# Patient Record
Sex: Female | Born: 1950 | Race: White | Hispanic: No | Smoking: Never smoker
Health system: Southern US, Community
[De-identification: ages and names within clinical notes are randomized; demographics above are authoritative.]

## PROBLEM LIST (undated history)

## (undated) DIAGNOSIS — F32A Depression, unspecified: Secondary | ICD-10-CM

## (undated) DIAGNOSIS — F419 Anxiety disorder, unspecified: Secondary | ICD-10-CM

## (undated) DIAGNOSIS — F329 Major depressive disorder, single episode, unspecified: Secondary | ICD-10-CM

## (undated) DIAGNOSIS — M75112 Incomplete rotator cuff tear or rupture of left shoulder, not specified as traumatic: Secondary | ICD-10-CM

## (undated) DIAGNOSIS — K219 Gastro-esophageal reflux disease without esophagitis: Secondary | ICD-10-CM

## (undated) DIAGNOSIS — I10 Essential (primary) hypertension: Secondary | ICD-10-CM

## (undated) HISTORY — PX: TONSILLECTOMY: SUR1361

## (undated) HISTORY — PX: ABDOMINAL HYSTERECTOMY: SHX81

## (undated) HISTORY — PX: FINGER SURGERY: SHX640

## (undated) HISTORY — PX: BREAST SURGERY: SHX581

---
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
## 1983-01-08 HISTORY — PX: AUGMENTATION MAMMAPLASTY: SUR837

## 2006-07-25 ENCOUNTER — Other Ambulatory Visit: Admission: RE | Admit: 2006-07-25 | Discharge: 2006-07-25 | Payer: Self-pay | Admitting: Family Medicine

## 2006-11-16 ENCOUNTER — Emergency Department (HOSPITAL_COMMUNITY): Admission: EM | Admit: 2006-11-16 | Discharge: 2006-11-16 | Payer: Self-pay | Admitting: Emergency Medicine

## 2010-10-27 ENCOUNTER — Emergency Department (HOSPITAL_COMMUNITY)
Admission: EM | Admit: 2010-10-27 | Discharge: 2010-10-27 | Disposition: A | Discharge status: Home / Self Care | Payer: 59 | Attending: Emergency Medicine | Admitting: Emergency Medicine

## 2010-10-27 ENCOUNTER — Emergency Department (HOSPITAL_COMMUNITY): Payer: 59

## 2010-10-27 DIAGNOSIS — R42 Dizziness and giddiness: Secondary | ICD-10-CM | POA: Insufficient documentation

## 2010-10-27 DIAGNOSIS — K5289 Other specified noninfective gastroenteritis and colitis: Secondary | ICD-10-CM | POA: Insufficient documentation

## 2010-10-27 LAB — DIFFERENTIAL
Basophils Absolute: 0 10*3/uL (ref 0.0–0.1)
Basophils Relative: 0 % (ref 0–1)
Eosinophils Relative: 1 % (ref 0–5)

## 2010-10-27 LAB — COMPREHENSIVE METABOLIC PANEL
AST: 19 U/L (ref 0–37)
Albumin: 4.2 g/dL (ref 3.5–5.2)
Alkaline Phosphatase: 76 U/L (ref 39–117)
BUN: 16 mg/dL (ref 6–23)
Chloride: 101 mEq/L (ref 96–112)
Glucose, Bld: 103 mg/dL — ABNORMAL HIGH (ref 70–99)
Potassium: 3.6 mEq/L (ref 3.5–5.1)
Total Protein: 7.2 g/dL (ref 6.0–8.3)

## 2010-10-27 LAB — CBC
MCH: 30.4 pg (ref 26.0–34.0)
MCHC: 33.6 g/dL (ref 30.0–36.0)
MCV: 90.5 fL (ref 78.0–100.0)
Platelets: 286 10*3/uL (ref 150–400)
RBC: 4.44 MIL/uL (ref 3.87–5.11)
RDW: 13.5 % (ref 11.5–15.5)

## 2010-10-27 LAB — URINALYSIS, ROUTINE W REFLEX MICROSCOPIC
Bilirubin Urine: NEGATIVE
Glucose, UA: NEGATIVE mg/dL
Ketones, ur: NEGATIVE mg/dL
pH: 6.5 (ref 5.0–8.0)

## 2010-10-27 LAB — LIPASE, BLOOD: Lipase: 28 U/L (ref 11–59)

## 2010-10-27 MED ORDER — IOHEXOL 300 MG/ML  SOLN
100.0000 mL | Freq: Once | INTRAMUSCULAR | Status: AC | PRN
  Administered 2010-10-27: 100 mL via INTRAVENOUS

## 2011-04-30 ENCOUNTER — Other Ambulatory Visit: Payer: Self-pay | Admitting: Gastroenterology

## 2011-05-02 ENCOUNTER — Ambulatory Visit
Admission: RE | Admit: 2011-05-02 | Discharge: 2011-05-02 | Disposition: A | Discharge status: Home / Self Care | Payer: 59 | Source: Ambulatory Visit | Attending: Gastroenterology | Admitting: Gastroenterology

## 2011-05-03 ENCOUNTER — Other Ambulatory Visit: Payer: 59

## 2014-08-03 ENCOUNTER — Ambulatory Visit
Admission: RE | Admit: 2014-08-03 | Discharge: 2014-08-03 | Disposition: A | Discharge status: Home / Self Care | Payer: Medicare Other | Source: Ambulatory Visit | Attending: Family Medicine | Admitting: Family Medicine

## 2014-08-03 ENCOUNTER — Other Ambulatory Visit: Payer: Self-pay | Admitting: Family Medicine

## 2014-08-03 DIAGNOSIS — M545 Low back pain, unspecified: Secondary | ICD-10-CM

## 2015-02-27 DIAGNOSIS — H2511 Age-related nuclear cataract, right eye: Secondary | ICD-10-CM | POA: Diagnosis not present

## 2015-02-27 DIAGNOSIS — H25811 Combined forms of age-related cataract, right eye: Secondary | ICD-10-CM | POA: Diagnosis not present

## 2015-02-28 DIAGNOSIS — H2512 Age-related nuclear cataract, left eye: Secondary | ICD-10-CM | POA: Diagnosis not present

## 2015-03-09 DIAGNOSIS — K219 Gastro-esophageal reflux disease without esophagitis: Secondary | ICD-10-CM | POA: Diagnosis not present

## 2015-03-09 DIAGNOSIS — R12 Heartburn: Secondary | ICD-10-CM | POA: Diagnosis not present

## 2015-03-16 DIAGNOSIS — K449 Diaphragmatic hernia without obstruction or gangrene: Secondary | ICD-10-CM | POA: Diagnosis not present

## 2015-03-16 DIAGNOSIS — R12 Heartburn: Secondary | ICD-10-CM | POA: Diagnosis not present

## 2015-03-16 DIAGNOSIS — K219 Gastro-esophageal reflux disease without esophagitis: Secondary | ICD-10-CM | POA: Diagnosis not present

## 2015-03-20 DIAGNOSIS — H2512 Age-related nuclear cataract, left eye: Secondary | ICD-10-CM | POA: Diagnosis not present

## 2015-03-20 DIAGNOSIS — H25812 Combined forms of age-related cataract, left eye: Secondary | ICD-10-CM | POA: Diagnosis not present

## 2015-05-09 DIAGNOSIS — H9319 Tinnitus, unspecified ear: Secondary | ICD-10-CM | POA: Diagnosis not present

## 2015-05-09 DIAGNOSIS — I10 Essential (primary) hypertension: Secondary | ICD-10-CM | POA: Diagnosis not present

## 2015-05-09 DIAGNOSIS — Z79890 Hormone replacement therapy (postmenopausal): Secondary | ICD-10-CM | POA: Diagnosis not present

## 2015-05-09 DIAGNOSIS — F339 Major depressive disorder, recurrent, unspecified: Secondary | ICD-10-CM | POA: Diagnosis not present

## 2015-05-09 DIAGNOSIS — Z79899 Other long term (current) drug therapy: Secondary | ICD-10-CM | POA: Diagnosis not present

## 2015-05-09 DIAGNOSIS — F411 Generalized anxiety disorder: Secondary | ICD-10-CM | POA: Diagnosis not present

## 2015-05-09 DIAGNOSIS — E559 Vitamin D deficiency, unspecified: Secondary | ICD-10-CM | POA: Diagnosis not present

## 2015-05-09 DIAGNOSIS — E7800 Pure hypercholesterolemia, unspecified: Secondary | ICD-10-CM | POA: Diagnosis not present

## 2015-05-09 DIAGNOSIS — M8580 Other specified disorders of bone density and structure, unspecified site: Secondary | ICD-10-CM | POA: Diagnosis not present

## 2015-08-01 DIAGNOSIS — H1132 Conjunctival hemorrhage, left eye: Secondary | ICD-10-CM | POA: Diagnosis not present

## 2015-11-16 DIAGNOSIS — K219 Gastro-esophageal reflux disease without esophagitis: Secondary | ICD-10-CM | POA: Diagnosis not present

## 2015-12-26 DIAGNOSIS — Z23 Encounter for immunization: Secondary | ICD-10-CM | POA: Diagnosis not present

## 2016-02-06 DIAGNOSIS — Z79899 Other long term (current) drug therapy: Secondary | ICD-10-CM | POA: Diagnosis not present

## 2016-02-06 DIAGNOSIS — E559 Vitamin D deficiency, unspecified: Secondary | ICD-10-CM | POA: Diagnosis not present

## 2016-02-06 DIAGNOSIS — I10 Essential (primary) hypertension: Secondary | ICD-10-CM | POA: Diagnosis not present

## 2016-02-06 DIAGNOSIS — E7800 Pure hypercholesterolemia, unspecified: Secondary | ICD-10-CM | POA: Diagnosis not present

## 2016-02-06 DIAGNOSIS — F339 Major depressive disorder, recurrent, unspecified: Secondary | ICD-10-CM | POA: Diagnosis not present

## 2016-02-06 DIAGNOSIS — Z23 Encounter for immunization: Secondary | ICD-10-CM | POA: Diagnosis not present

## 2016-02-06 DIAGNOSIS — K219 Gastro-esophageal reflux disease without esophagitis: Secondary | ICD-10-CM | POA: Diagnosis not present

## 2016-02-06 DIAGNOSIS — M8580 Other specified disorders of bone density and structure, unspecified site: Secondary | ICD-10-CM | POA: Diagnosis not present

## 2016-02-06 DIAGNOSIS — F411 Generalized anxiety disorder: Secondary | ICD-10-CM | POA: Diagnosis not present

## 2016-02-06 DIAGNOSIS — Z0000 Encounter for general adult medical examination without abnormal findings: Secondary | ICD-10-CM | POA: Diagnosis not present

## 2016-02-07 ENCOUNTER — Other Ambulatory Visit: Payer: Self-pay | Admitting: Family Medicine

## 2016-02-07 DIAGNOSIS — Z01411 Encounter for gynecological examination (general) (routine) with abnormal findings: Secondary | ICD-10-CM

## 2016-02-13 ENCOUNTER — Ambulatory Visit
Admission: RE | Admit: 2016-02-13 | Discharge: 2016-02-13 | Disposition: A | Discharge status: Home / Self Care | Payer: Medicare Other | Source: Ambulatory Visit | Attending: Family Medicine | Admitting: Family Medicine

## 2016-02-13 DIAGNOSIS — Z1231 Encounter for screening mammogram for malignant neoplasm of breast: Secondary | ICD-10-CM | POA: Diagnosis not present

## 2016-02-13 DIAGNOSIS — R935 Abnormal findings on diagnostic imaging of other abdominal regions, including retroperitoneum: Secondary | ICD-10-CM | POA: Diagnosis not present

## 2016-02-13 DIAGNOSIS — Z01411 Encounter for gynecological examination (general) (routine) with abnormal findings: Secondary | ICD-10-CM

## 2016-02-13 DIAGNOSIS — M8589 Other specified disorders of bone density and structure, multiple sites: Secondary | ICD-10-CM | POA: Diagnosis not present

## 2016-02-20 DIAGNOSIS — M25561 Pain in right knee: Secondary | ICD-10-CM | POA: Diagnosis not present

## 2016-05-01 DIAGNOSIS — B353 Tinea pedis: Secondary | ICD-10-CM | POA: Diagnosis not present

## 2016-08-29 DIAGNOSIS — L570 Actinic keratosis: Secondary | ICD-10-CM | POA: Diagnosis not present

## 2016-08-29 DIAGNOSIS — L814 Other melanin hyperpigmentation: Secondary | ICD-10-CM | POA: Diagnosis not present

## 2016-08-29 DIAGNOSIS — L821 Other seborrheic keratosis: Secondary | ICD-10-CM | POA: Diagnosis not present

## 2016-08-29 DIAGNOSIS — D1801 Hemangioma of skin and subcutaneous tissue: Secondary | ICD-10-CM | POA: Diagnosis not present

## 2017-01-11 DIAGNOSIS — J069 Acute upper respiratory infection, unspecified: Secondary | ICD-10-CM | POA: Diagnosis not present

## 2017-01-16 DIAGNOSIS — Z23 Encounter for immunization: Secondary | ICD-10-CM | POA: Diagnosis not present

## 2017-01-16 DIAGNOSIS — E7800 Pure hypercholesterolemia, unspecified: Secondary | ICD-10-CM | POA: Diagnosis not present

## 2017-01-16 DIAGNOSIS — E559 Vitamin D deficiency, unspecified: Secondary | ICD-10-CM | POA: Diagnosis not present

## 2017-01-16 DIAGNOSIS — I10 Essential (primary) hypertension: Secondary | ICD-10-CM | POA: Diagnosis not present

## 2017-02-07 DIAGNOSIS — K219 Gastro-esophageal reflux disease without esophagitis: Secondary | ICD-10-CM | POA: Diagnosis not present

## 2017-02-07 DIAGNOSIS — Z23 Encounter for immunization: Secondary | ICD-10-CM | POA: Diagnosis not present

## 2017-02-07 DIAGNOSIS — E7800 Pure hypercholesterolemia, unspecified: Secondary | ICD-10-CM | POA: Diagnosis not present

## 2017-02-07 DIAGNOSIS — E559 Vitamin D deficiency, unspecified: Secondary | ICD-10-CM | POA: Diagnosis not present

## 2017-02-10 DIAGNOSIS — L820 Inflamed seborrheic keratosis: Secondary | ICD-10-CM | POA: Diagnosis not present

## 2017-02-10 DIAGNOSIS — D0462 Carcinoma in situ of skin of left upper limb, including shoulder: Secondary | ICD-10-CM | POA: Diagnosis not present

## 2017-02-10 DIAGNOSIS — L218 Other seborrheic dermatitis: Secondary | ICD-10-CM | POA: Diagnosis not present

## 2017-02-10 DIAGNOSIS — D485 Neoplasm of uncertain behavior of skin: Secondary | ICD-10-CM | POA: Diagnosis not present

## 2017-02-10 DIAGNOSIS — L570 Actinic keratosis: Secondary | ICD-10-CM | POA: Diagnosis not present

## 2017-04-09 DIAGNOSIS — B078 Other viral warts: Secondary | ICD-10-CM | POA: Diagnosis not present

## 2017-04-09 DIAGNOSIS — D0462 Carcinoma in situ of skin of left upper limb, including shoulder: Secondary | ICD-10-CM | POA: Diagnosis not present

## 2017-04-15 DIAGNOSIS — E7800 Pure hypercholesterolemia, unspecified: Secondary | ICD-10-CM | POA: Diagnosis not present

## 2017-04-15 DIAGNOSIS — E559 Vitamin D deficiency, unspecified: Secondary | ICD-10-CM | POA: Diagnosis not present

## 2017-04-15 DIAGNOSIS — I10 Essential (primary) hypertension: Secondary | ICD-10-CM | POA: Diagnosis not present

## 2017-04-15 DIAGNOSIS — F411 Generalized anxiety disorder: Secondary | ICD-10-CM | POA: Diagnosis not present

## 2017-05-07 ENCOUNTER — Ambulatory Visit (INDEPENDENT_AMBULATORY_CARE_PROVIDER_SITE_OTHER): Payer: Medicare Other

## 2017-05-07 ENCOUNTER — Encounter: Payer: Self-pay | Admitting: Podiatry

## 2017-05-07 ENCOUNTER — Ambulatory Visit: Payer: Medicare Other | Admitting: Podiatry

## 2017-05-07 VITALS — BP 132/72 | HR 66

## 2017-05-07 DIAGNOSIS — M778 Other enthesopathies, not elsewhere classified: Secondary | ICD-10-CM

## 2017-05-07 DIAGNOSIS — L84 Corns and callosities: Secondary | ICD-10-CM

## 2017-05-07 DIAGNOSIS — M2040 Other hammer toe(s) (acquired), unspecified foot: Secondary | ICD-10-CM | POA: Diagnosis not present

## 2017-05-07 DIAGNOSIS — M779 Enthesopathy, unspecified: Secondary | ICD-10-CM

## 2017-05-07 MED ORDER — TRIAMCINOLONE ACETONIDE 10 MG/ML IJ SUSP
10.0000 mg | Freq: Once | INTRAMUSCULAR | Status: AC
  Administered 2017-05-07: 10 mg

## 2017-05-07 NOTE — Progress Notes (Signed)
Subjective:   Patient ID: Monica Houston, female   DOB: 67 y.o.   MRN: [id number]   HPI Patient presents stating I am having a lot of pain between the fourth and fifth toes of both my feet with the right being worse and is been going on for at least 10 years and I do not remember specific injury.  Patient states she tries to trim it and soak it as best she can and she does not smoke and likes to be active   Review of Systems  All other systems reviewed and are negative.       Objective:  Physical Exam  Constitutional: She appears well-developed and well-nourished.  Cardiovascular: Intact distal pulses.  Pulmonary/Chest: Effort normal.  Musculoskeletal: Normal range of motion.  Neurological: She is alert.  Skin: Skin is warm.  Nursing note and vitals reviewed.   Neurovascular status intact muscle strength is adequate range of motion within normal limits with severe keratotic lesion between the fourth and fifth toes of both feet that are inflamed and painful with pressure.  States that she has tried to trim without relief and does have good digital perfusion and well oriented x3     Assessment:  Inflammatory capsulitis fourth interspace right over left hammertoe deformity and abutment of the toe against the fourth toe     Plan:  H&P conditions reviewed and carefully injected the capsule of the fourth interspace bilateral 3 mg Kenalog 5 mg Xylocaine and around the fourth MPJ.  I then debrided the lesions and discussed ultimate partial soft tissue syndactylization with hammertoe arthroplasty procedures which I think most likely will be necessary  X-rays indicate there is slight enlargement of the head of the proximal phalanx fifth digit bilateral with rotation of the toe

## 2017-05-14 DIAGNOSIS — H524 Presbyopia: Secondary | ICD-10-CM | POA: Diagnosis not present

## 2017-07-08 DIAGNOSIS — H26491 Other secondary cataract, right eye: Secondary | ICD-10-CM | POA: Diagnosis not present

## 2017-07-08 DIAGNOSIS — Z961 Presence of intraocular lens: Secondary | ICD-10-CM | POA: Diagnosis not present

## 2017-07-08 DIAGNOSIS — H26492 Other secondary cataract, left eye: Secondary | ICD-10-CM | POA: Diagnosis not present

## 2017-07-08 DIAGNOSIS — I10 Essential (primary) hypertension: Secondary | ICD-10-CM | POA: Diagnosis not present

## 2017-07-29 DIAGNOSIS — H26492 Other secondary cataract, left eye: Secondary | ICD-10-CM | POA: Diagnosis not present

## 2017-09-09 DIAGNOSIS — Z23 Encounter for immunization: Secondary | ICD-10-CM | POA: Diagnosis not present

## 2017-09-09 DIAGNOSIS — M545 Low back pain: Secondary | ICD-10-CM | POA: Diagnosis not present

## 2017-09-11 DIAGNOSIS — K219 Gastro-esophageal reflux disease without esophagitis: Secondary | ICD-10-CM | POA: Diagnosis not present

## 2017-09-15 DIAGNOSIS — M2560 Stiffness of unspecified joint, not elsewhere classified: Secondary | ICD-10-CM | POA: Diagnosis not present

## 2017-09-15 DIAGNOSIS — M5431 Sciatica, right side: Secondary | ICD-10-CM | POA: Diagnosis not present

## 2017-09-15 DIAGNOSIS — M6281 Muscle weakness (generalized): Secondary | ICD-10-CM | POA: Diagnosis not present

## 2017-09-15 DIAGNOSIS — M545 Low back pain: Secondary | ICD-10-CM | POA: Diagnosis not present

## 2017-09-19 DIAGNOSIS — M6281 Muscle weakness (generalized): Secondary | ICD-10-CM | POA: Diagnosis not present

## 2017-09-19 DIAGNOSIS — M5431 Sciatica, right side: Secondary | ICD-10-CM | POA: Diagnosis not present

## 2017-09-19 DIAGNOSIS — M2560 Stiffness of unspecified joint, not elsewhere classified: Secondary | ICD-10-CM | POA: Diagnosis not present

## 2017-09-19 DIAGNOSIS — M545 Low back pain: Secondary | ICD-10-CM | POA: Diagnosis not present

## 2017-09-24 DIAGNOSIS — M6281 Muscle weakness (generalized): Secondary | ICD-10-CM | POA: Diagnosis not present

## 2017-09-24 DIAGNOSIS — M2560 Stiffness of unspecified joint, not elsewhere classified: Secondary | ICD-10-CM | POA: Diagnosis not present

## 2017-09-24 DIAGNOSIS — M545 Low back pain: Secondary | ICD-10-CM | POA: Diagnosis not present

## 2017-09-24 DIAGNOSIS — M5431 Sciatica, right side: Secondary | ICD-10-CM | POA: Diagnosis not present

## 2017-09-26 DIAGNOSIS — M6281 Muscle weakness (generalized): Secondary | ICD-10-CM | POA: Diagnosis not present

## 2017-09-26 DIAGNOSIS — M545 Low back pain: Secondary | ICD-10-CM | POA: Diagnosis not present

## 2017-09-26 DIAGNOSIS — M5431 Sciatica, right side: Secondary | ICD-10-CM | POA: Diagnosis not present

## 2017-09-26 DIAGNOSIS — M2560 Stiffness of unspecified joint, not elsewhere classified: Secondary | ICD-10-CM | POA: Diagnosis not present

## 2017-09-29 DIAGNOSIS — M6281 Muscle weakness (generalized): Secondary | ICD-10-CM | POA: Diagnosis not present

## 2017-09-29 DIAGNOSIS — M2560 Stiffness of unspecified joint, not elsewhere classified: Secondary | ICD-10-CM | POA: Diagnosis not present

## 2017-09-29 DIAGNOSIS — M5431 Sciatica, right side: Secondary | ICD-10-CM | POA: Diagnosis not present

## 2017-09-29 DIAGNOSIS — M545 Low back pain: Secondary | ICD-10-CM | POA: Diagnosis not present

## 2017-11-07 DIAGNOSIS — E559 Vitamin D deficiency, unspecified: Secondary | ICD-10-CM | POA: Diagnosis not present

## 2017-11-07 DIAGNOSIS — Z1159 Encounter for screening for other viral diseases: Secondary | ICD-10-CM | POA: Diagnosis not present

## 2017-11-07 DIAGNOSIS — E7800 Pure hypercholesterolemia, unspecified: Secondary | ICD-10-CM | POA: Diagnosis not present

## 2017-11-07 DIAGNOSIS — Z8589 Personal history of malignant neoplasm of other organs and systems: Secondary | ICD-10-CM | POA: Diagnosis not present

## 2017-11-07 DIAGNOSIS — I10 Essential (primary) hypertension: Secondary | ICD-10-CM | POA: Diagnosis not present

## 2018-01-12 DIAGNOSIS — D1801 Hemangioma of skin and subcutaneous tissue: Secondary | ICD-10-CM | POA: Diagnosis not present

## 2018-01-12 DIAGNOSIS — L820 Inflamed seborrheic keratosis: Secondary | ICD-10-CM | POA: Diagnosis not present

## 2018-01-12 DIAGNOSIS — D225 Melanocytic nevi of trunk: Secondary | ICD-10-CM | POA: Diagnosis not present

## 2018-01-12 DIAGNOSIS — L821 Other seborrheic keratosis: Secondary | ICD-10-CM | POA: Diagnosis not present

## 2018-02-26 DIAGNOSIS — Z0120 Encounter for dental examination and cleaning without abnormal findings: Secondary | ICD-10-CM | POA: Diagnosis not present

## 2018-06-09 DIAGNOSIS — S46011A Strain of muscle(s) and tendon(s) of the rotator cuff of right shoulder, initial encounter: Secondary | ICD-10-CM | POA: Diagnosis not present

## 2018-06-25 DIAGNOSIS — S46011D Strain of muscle(s) and tendon(s) of the rotator cuff of right shoulder, subsequent encounter: Secondary | ICD-10-CM | POA: Diagnosis not present

## 2018-07-02 DIAGNOSIS — M25511 Pain in right shoulder: Secondary | ICD-10-CM | POA: Diagnosis not present

## 2018-07-23 DIAGNOSIS — M7541 Impingement syndrome of right shoulder: Secondary | ICD-10-CM | POA: Diagnosis not present

## 2018-07-27 DIAGNOSIS — Z01818 Encounter for other preprocedural examination: Secondary | ICD-10-CM | POA: Diagnosis not present

## 2018-07-27 DIAGNOSIS — M75101 Unspecified rotator cuff tear or rupture of right shoulder, not specified as traumatic: Secondary | ICD-10-CM | POA: Diagnosis not present

## 2018-07-29 ENCOUNTER — Encounter (HOSPITAL_BASED_OUTPATIENT_CLINIC_OR_DEPARTMENT_OTHER): Payer: Self-pay | Admitting: *Deleted

## 2018-07-29 ENCOUNTER — Other Ambulatory Visit: Payer: Self-pay

## 2018-07-29 DIAGNOSIS — I10 Essential (primary) hypertension: Secondary | ICD-10-CM | POA: Diagnosis present

## 2018-07-29 DIAGNOSIS — M75112 Incomplete rotator cuff tear or rupture of left shoulder, not specified as traumatic: Secondary | ICD-10-CM | POA: Diagnosis present

## 2018-07-29 DIAGNOSIS — F329 Major depressive disorder, single episode, unspecified: Secondary | ICD-10-CM | POA: Diagnosis present

## 2018-07-29 DIAGNOSIS — K219 Gastro-esophageal reflux disease without esophagitis: Secondary | ICD-10-CM | POA: Diagnosis present

## 2018-07-29 DIAGNOSIS — F32A Depression, unspecified: Secondary | ICD-10-CM | POA: Diagnosis present

## 2018-07-29 DIAGNOSIS — F419 Anxiety disorder, unspecified: Secondary | ICD-10-CM | POA: Diagnosis present

## 2018-07-29 NOTE — H&P (Signed)
Monica Houston is an 68 y.o. female.   Chief Complaint: right shoulder rotator cuff tear HPI: Monica Houston is a 68 year-old seen for follow up evaluation from her persistent significant right shoulder pain.  She underwent an MRI of this shoulder on July 02, 2018 that revealed rotator cuff tendonitis with partial tearing with impingement with significant AC joint osteoarthritis and impingement as well, as well as some moderate glenohumeral arthritis.  We had injected this shoulder with only temporary relief.  She has been doing home exercises intermittently.    Past Medical History:  Diagnosis Date  . Anxiety   . Depression   . GERD (gastroesophageal reflux disease)   . Hypertension     Past Surgical History:  Procedure Laterality Date  . ABDOMINAL HYSTERECTOMY    . BREAST SURGERY     breast augmentation  . CESAREAN SECTION     x2  . TONSILLECTOMY      History reviewed. No pertinent family history. Social History:  reports that she has never smoked. She has never used smokeless tobacco. She reports current alcohol use. She reports that she does not use drugs.  Allergies: No Known Allergies  No medications prior to admission.    No results found for this or any previous visit (from the past 48 hour(s)). No results found.  Review of Systems  Constitutional: Negative.   HENT: Negative.   Eyes: Negative.   Respiratory: Negative.   Cardiovascular: Negative.   Gastrointestinal: Negative.   Genitourinary: Negative.   Musculoskeletal: Positive for joint pain.  Skin: Negative.   Neurological: Negative.   Endo/Heme/Allergies: Negative.   Psychiatric/Behavioral: Negative.     Height 5\' 2"  (1.575 m), weight 63.5 kg. Physical Exam  Constitutional: She is oriented to person, place, and time. She appears well-developed and well-nourished.  HENT:  Head: Normocephalic and atraumatic.  Mouth/Throat: Oropharynx is clear and moist.  Eyes: Pupils are equal, round, and reactive to light.   Neck: Neck supple.  Cardiovascular: Normal rate.  Respiratory: Effort normal.  GI: Soft.  Genitourinary:    Genitourinary Comments: Not pertinent to current symptomatology therefore not examined.   Musculoskeletal:     Comments: Examination of her right shoulder reveals forward flexion of 170 with pain and positive impingement.  Abduction of 160 with pain and positive impingement.  Internal and external rotation of 80 degrees with pain and mild weakness and impingement.  No instability.  Examination of her left shoulder reveals full range of motion without pain, weakness or instability.  Vascular exam: Pulses are 2+ and symmetric.  Neurologic exam: Distal motor and sensory examination is within normal limits.  Neurological: She is alert and oriented to person, place, and time.  Skin: Skin is warm and dry.  Psychiatric: She has a normal mood and affect.     Assessment Principal Problem:   Incomplete rotator cuff tear or rupture of left shoulder, not specified as traumatic Active Problems:   Hypertension   GERD (gastroesophageal reflux disease)   Depression   Anxiety   Plan At this point due to failed conservative care would recommend that we proceed with right shoulder arthroscopy with rotator cuff debridement with subacromial decompression and DCE.  Risks, complications and benefits of the surgery have been described to her in detail and she understands this completely.    Carlisle Enke J Wael Maestas, PA-C 07/29/2018, 6:12 PM

## 2018-08-01 ENCOUNTER — Other Ambulatory Visit (HOSPITAL_COMMUNITY)
Admission: RE | Admit: 2018-08-01 | Discharge: 2018-08-01 | Disposition: A | Discharge status: Home / Self Care | Payer: Medicare Other | Source: Ambulatory Visit | Attending: Orthopedic Surgery | Admitting: Orthopedic Surgery

## 2018-08-01 DIAGNOSIS — Z1159 Encounter for screening for other viral diseases: Secondary | ICD-10-CM | POA: Diagnosis not present

## 2018-08-01 LAB — SARS CORONAVIRUS 2 (TAT 6-24 HRS): SARS Coronavirus 2: NEGATIVE

## 2018-08-05 ENCOUNTER — Encounter (HOSPITAL_BASED_OUTPATIENT_CLINIC_OR_DEPARTMENT_OTHER)
Admission: RE | Disposition: A | Discharge status: Home / Self Care | Payer: Self-pay | Source: Home / Self Care | Attending: Orthopedic Surgery

## 2018-08-05 ENCOUNTER — Ambulatory Visit (HOSPITAL_BASED_OUTPATIENT_CLINIC_OR_DEPARTMENT_OTHER): Payer: Medicare Other | Admitting: Certified Registered"

## 2018-08-05 ENCOUNTER — Other Ambulatory Visit: Payer: Self-pay

## 2018-08-05 ENCOUNTER — Ambulatory Visit (HOSPITAL_BASED_OUTPATIENT_CLINIC_OR_DEPARTMENT_OTHER)
Admission: RE | Admit: 2018-08-05 | Discharge: 2018-08-05 | Disposition: A | Discharge status: Home / Self Care | Payer: Medicare Other | Attending: Orthopedic Surgery | Admitting: Orthopedic Surgery

## 2018-08-05 ENCOUNTER — Encounter (HOSPITAL_BASED_OUTPATIENT_CLINIC_OR_DEPARTMENT_OTHER): Payer: Self-pay | Admitting: Anesthesiology

## 2018-08-05 DIAGNOSIS — M24111 Other articular cartilage disorders, right shoulder: Secondary | ICD-10-CM | POA: Diagnosis not present

## 2018-08-05 DIAGNOSIS — F329 Major depressive disorder, single episode, unspecified: Secondary | ICD-10-CM | POA: Insufficient documentation

## 2018-08-05 DIAGNOSIS — F32A Depression, unspecified: Secondary | ICD-10-CM | POA: Diagnosis present

## 2018-08-05 DIAGNOSIS — Z90710 Acquired absence of both cervix and uterus: Secondary | ICD-10-CM | POA: Insufficient documentation

## 2018-08-05 DIAGNOSIS — M75101 Unspecified rotator cuff tear or rupture of right shoulder, not specified as traumatic: Secondary | ICD-10-CM | POA: Diagnosis present

## 2018-08-05 DIAGNOSIS — M7541 Impingement syndrome of right shoulder: Secondary | ICD-10-CM | POA: Insufficient documentation

## 2018-08-05 DIAGNOSIS — M94211 Chondromalacia, right shoulder: Secondary | ICD-10-CM | POA: Diagnosis not present

## 2018-08-05 DIAGNOSIS — I10 Essential (primary) hypertension: Secondary | ICD-10-CM | POA: Diagnosis not present

## 2018-08-05 DIAGNOSIS — M75111 Incomplete rotator cuff tear or rupture of right shoulder, not specified as traumatic: Secondary | ICD-10-CM | POA: Diagnosis not present

## 2018-08-05 DIAGNOSIS — M19011 Primary osteoarthritis, right shoulder: Secondary | ICD-10-CM | POA: Insufficient documentation

## 2018-08-05 DIAGNOSIS — K219 Gastro-esophageal reflux disease without esophagitis: Secondary | ICD-10-CM | POA: Diagnosis not present

## 2018-08-05 DIAGNOSIS — F419 Anxiety disorder, unspecified: Secondary | ICD-10-CM | POA: Diagnosis present

## 2018-08-05 DIAGNOSIS — G8918 Other acute postprocedural pain: Secondary | ICD-10-CM | POA: Diagnosis not present

## 2018-08-05 DIAGNOSIS — M75112 Incomplete rotator cuff tear or rupture of left shoulder, not specified as traumatic: Secondary | ICD-10-CM | POA: Diagnosis present

## 2018-08-05 HISTORY — PX: RESECTION DISTAL CLAVICAL: SHX5053

## 2018-08-05 HISTORY — DX: Gastro-esophageal reflux disease without esophagitis: K21.9

## 2018-08-05 HISTORY — PX: SHOULDER ARTHROSCOPY WITH SUBACROMIAL DECOMPRESSION: SHX5684

## 2018-08-05 HISTORY — DX: Anxiety disorder, unspecified: F41.9

## 2018-08-05 HISTORY — DX: Incomplete rotator cuff tear or rupture of left shoulder, not specified as traumatic: M75.112

## 2018-08-05 HISTORY — DX: Essential (primary) hypertension: I10

## 2018-08-05 HISTORY — DX: Depression, unspecified: F32.A

## 2018-08-05 LAB — BASIC METABOLIC PANEL
Anion gap: 11 (ref 5–15)
BUN: 15 mg/dL (ref 8–23)
CO2: 24 mmol/L (ref 22–32)
Calcium: 9.2 mg/dL (ref 8.9–10.3)
Chloride: 106 mmol/L (ref 98–111)
Creatinine, Ser: 0.79 mg/dL (ref 0.44–1.00)
GFR calc Af Amer: >60 mL/min (ref >60)
GFR calc non Af Amer: >60 mL/min (ref >60)
Glucose, Bld: 99 mg/dL (ref 70–99)
Potassium: 3.8 mmol/L (ref 3.5–5.1)
Sodium: 141 mmol/L (ref 135–145)

## 2018-08-05 SURGERY — SHOULDER ARTHROSCOPY WITH SUBACROMIAL DECOMPRESSION
Anesthesia: General | Site: Shoulder | Laterality: Right

## 2018-08-05 MED ORDER — FENTANYL CITRATE (PF) 100 MCG/2ML IJ SOLN
INTRAMUSCULAR | Status: AC
  Filled 2018-08-05: qty 2

## 2018-08-05 MED ORDER — OXYCODONE HCL 5 MG/5ML PO SOLN: 5.0000 mg | Freq: Once | ORAL | Status: DC | PRN

## 2018-08-05 MED ORDER — SODIUM CHLORIDE 0.9 % IR SOLN
Status: DC | PRN
  Administered 2018-08-05: 2 mL

## 2018-08-05 MED ORDER — BUPIVACAINE LIPOSOME 1.3 % IJ SUSP
INTRAMUSCULAR | Status: DC | PRN
  Administered 2018-08-05: 10 mL via PERINEURAL

## 2018-08-05 MED ORDER — SCOPOLAMINE 1 MG/3DAYS TD PT72: 1.0000 | MEDICATED_PATCH | Freq: Once | TRANSDERMAL | Status: DC

## 2018-08-05 MED ORDER — MIDAZOLAM HCL 2 MG/2ML IJ SOLN
1.0000 mg | INTRAMUSCULAR | Status: DC | PRN
  Administered 2018-08-05: 1 mg via INTRAVENOUS

## 2018-08-05 MED ORDER — PROPOFOL 500 MG/50ML IV EMUL
INTRAVENOUS | Status: AC
  Filled 2018-08-05: qty 50

## 2018-08-05 MED ORDER — CHLORHEXIDINE GLUCONATE 4 % EX LIQD: 60.0000 mL | Freq: Once | CUTANEOUS | Status: DC

## 2018-08-05 MED ORDER — LIDOCAINE 2% (20 MG/ML) 5 ML SYRINGE
INTRAMUSCULAR | Status: AC
  Filled 2018-08-05: qty 5

## 2018-08-05 MED ORDER — FENTANYL CITRATE (PF) 100 MCG/2ML IJ SOLN: 25.0000 ug | INTRAMUSCULAR | Status: DC | PRN

## 2018-08-05 MED ORDER — DEXAMETHASONE SODIUM PHOSPHATE 10 MG/ML IJ SOLN: 8.0000 mg | Freq: Once | INTRAMUSCULAR | Status: DC

## 2018-08-05 MED ORDER — BUPIVACAINE-EPINEPHRINE (PF) 0.25% -1:200000 IJ SOLN
INTRAMUSCULAR | Status: AC
  Filled 2018-08-05: qty 60

## 2018-08-05 MED ORDER — DEXAMETHASONE SODIUM PHOSPHATE 4 MG/ML IJ SOLN
INTRAMUSCULAR | Status: DC | PRN
  Administered 2018-08-05: 4 mg via INTRAVENOUS

## 2018-08-05 MED ORDER — ROCURONIUM BROMIDE 100 MG/10ML IV SOLN
INTRAVENOUS | Status: DC | PRN
  Administered 2018-08-05: 50 mg via INTRAVENOUS

## 2018-08-05 MED ORDER — DEXAMETHASONE SODIUM PHOSPHATE 10 MG/ML IJ SOLN
INTRAMUSCULAR | Status: AC
  Filled 2018-08-05: qty 1

## 2018-08-05 MED ORDER — MIDAZOLAM HCL 2 MG/2ML IJ SOLN
INTRAMUSCULAR | Status: AC
  Filled 2018-08-05: qty 2

## 2018-08-05 MED ORDER — POVIDONE-IODINE 10 % EX SWAB: 2.0000 "application " | Freq: Once | CUTANEOUS | Status: DC

## 2018-08-05 MED ORDER — LACTATED RINGERS IV SOLN
INTRAVENOUS | Status: DC
  Administered 2018-08-05: 10:00:00 via INTRAVENOUS

## 2018-08-05 MED ORDER — OXYCODONE HCL 5 MG PO TABS: 5.0000 mg | ORAL_TABLET | ORAL | 0 refills | Status: DC | PRN

## 2018-08-05 MED ORDER — FENTANYL CITRATE (PF) 100 MCG/2ML IJ SOLN
50.0000 ug | INTRAMUSCULAR | Status: DC | PRN
  Administered 2018-08-05: 50 ug via INTRAVENOUS

## 2018-08-05 MED ORDER — OXYCODONE HCL 5 MG PO TABS: 5.0000 mg | ORAL_TABLET | Freq: Once | ORAL | Status: DC | PRN

## 2018-08-05 MED ORDER — LIDOCAINE 2% (20 MG/ML) 5 ML SYRINGE
INTRAMUSCULAR | Status: DC | PRN
  Administered 2018-08-05: 40 mg via INTRAVENOUS

## 2018-08-05 MED ORDER — EPINEPHRINE PF 1 MG/ML IJ SOLN
INTRAMUSCULAR | Status: AC
  Filled 2018-08-05: qty 2

## 2018-08-05 MED ORDER — ONDANSETRON HCL 4 MG/2ML IJ SOLN: 4.0000 mg | Freq: Once | INTRAMUSCULAR | Status: DC | PRN

## 2018-08-05 MED ORDER — PROPOFOL 10 MG/ML IV BOLUS
INTRAVENOUS | Status: DC | PRN
  Administered 2018-08-05: 150 mg via INTRAVENOUS

## 2018-08-05 MED ORDER — BUPIVACAINE HCL (PF) 0.25 % IJ SOLN
INTRAMUSCULAR | Status: AC
  Filled 2018-08-05: qty 30

## 2018-08-05 MED ORDER — ONDANSETRON HCL 4 MG/2ML IJ SOLN
INTRAMUSCULAR | Status: AC
  Filled 2018-08-05: qty 2

## 2018-08-05 MED ORDER — CEFAZOLIN SODIUM-DEXTROSE 2-4 GM/100ML-% IV SOLN
2.0000 g | INTRAVENOUS | Status: AC
  Administered 2018-08-05: 11:00:00 2 g via INTRAVENOUS

## 2018-08-05 MED ORDER — SUGAMMADEX SODIUM 200 MG/2ML IV SOLN
INTRAVENOUS | Status: DC | PRN
  Administered 2018-08-05: 200 mg via INTRAVENOUS

## 2018-08-05 MED ORDER — CEFAZOLIN SODIUM-DEXTROSE 2-4 GM/100ML-% IV SOLN
INTRAVENOUS | Status: AC
  Filled 2018-08-05: qty 100

## 2018-08-05 MED ORDER — EPHEDRINE SULFATE 50 MG/ML IJ SOLN
INTRAMUSCULAR | Status: DC | PRN
  Administered 2018-08-05: 10 mg via INTRAVENOUS
  Administered 2018-08-05: 5 mg via INTRAVENOUS

## 2018-08-05 MED ORDER — LACTATED RINGERS IV SOLN
INTRAVENOUS | Status: DC
  Administered 2018-08-05: 10 mL via INTRAVENOUS
  Administered 2018-08-05: 10:00:00 via INTRAVENOUS

## 2018-08-05 MED ORDER — BUPIVACAINE HCL (PF) 0.5 % IJ SOLN
INTRAMUSCULAR | Status: DC | PRN
  Administered 2018-08-05: 15 mL via PERINEURAL

## 2018-08-05 MED ORDER — POVIDONE-IODINE 7.5 % EX SOLN: Freq: Once | CUTANEOUS | Status: DC

## 2018-08-05 MED ORDER — ONDANSETRON HCL 4 MG/2ML IJ SOLN
INTRAMUSCULAR | Status: DC | PRN
  Administered 2018-08-05: 4 mg via INTRAVENOUS

## 2018-08-05 SURGICAL SUPPLY — 79 items
BENZOIN TINCTURE PRP APPL 2/3 (GAUZE/BANDAGES/DRESSINGS) IMPLANT
BLADE EXCALIBUR 4.0X13 (MISCELLANEOUS) IMPLANT
BLADE SURG 15 STRL LF DISP TIS (BLADE) IMPLANT
BLADE SURG 15 STRL SS (BLADE)
BNDG COHESIVE 4X5 TAN STRL (GAUZE/BANDAGES/DRESSINGS) IMPLANT
BURR OVAL 8 FLU 5.0X13 (MISCELLANEOUS) ×3 IMPLANT
CANNULA TWIST IN 8.25X7CM (CANNULA) IMPLANT
COVER WAND RF STERILE (DRAPES) IMPLANT
DECANTER SPIKE VIAL GLASS SM (MISCELLANEOUS) IMPLANT
DISSECTOR  3.8MM X 13CM (MISCELLANEOUS) ×1
DISSECTOR 3.8MM X 13CM (MISCELLANEOUS) ×2 IMPLANT
DRAPE HALF SHEET 70X43 (DRAPES) IMPLANT
DRAPE OEC MINIVIEW 54X84 (DRAPES) ×3 IMPLANT
DRAPE SHOULDER BEACH CHAIR (DRAPES) ×3 IMPLANT
DRAPE U-SHAPE 47X51 STRL (DRAPES) ×6 IMPLANT
DRSG PAD ABDOMINAL 8X10 ST (GAUZE/BANDAGES/DRESSINGS) ×3 IMPLANT
DURAPREP 26ML APPLICATOR (WOUND CARE) ×3 IMPLANT
ELECT REM PT RETURN 9FT ADLT (ELECTROSURGICAL) ×3
ELECTRODE REM PT RTRN 9FT ADLT (ELECTROSURGICAL) ×2 IMPLANT
GAUZE SPONGE 4X4 12PLY STRL (GAUZE/BANDAGES/DRESSINGS) ×3 IMPLANT
GAUZE XEROFORM 1X8 LF (GAUZE/BANDAGES/DRESSINGS) ×3 IMPLANT
GLOVE BIO SURGEON STRL SZ7 (GLOVE) IMPLANT
GLOVE BIOGEL PI IND STRL 7.0 (GLOVE) ×2 IMPLANT
GLOVE BIOGEL PI IND STRL 7.5 (GLOVE) ×2 IMPLANT
GLOVE BIOGEL PI IND STRL 8 (GLOVE) ×2 IMPLANT
GLOVE BIOGEL PI INDICATOR 7.0 (GLOVE) ×1
GLOVE BIOGEL PI INDICATOR 7.5 (GLOVE) ×1
GLOVE BIOGEL PI INDICATOR 8 (GLOVE) ×1
GLOVE ECLIPSE 6.5 STRL STRAW (GLOVE) ×3 IMPLANT
GLOVE SS BIOGEL STRL SZ 7.5 (GLOVE) ×2 IMPLANT
GLOVE SUPERSENSE BIOGEL SZ 7.5 (GLOVE) ×1
GOWN STRL REUS W/ TWL LRG LVL3 (GOWN DISPOSABLE) ×4 IMPLANT
GOWN STRL REUS W/ TWL XL LVL3 (GOWN DISPOSABLE) ×2 IMPLANT
GOWN STRL REUS W/TWL LRG LVL3 (GOWN DISPOSABLE) ×2
GOWN STRL REUS W/TWL XL LVL3 (GOWN DISPOSABLE) ×1
LOOP 2 FIBERLINK CLOSED (SUTURE) IMPLANT
MANIFOLD NEPTUNE II (INSTRUMENTS) ×3 IMPLANT
NDL SAFETY ECLIPSE 18X1.5 (NEEDLE) ×2 IMPLANT
NDL SUT 6 .5 CRC .975X.05 MAYO (NEEDLE) IMPLANT
NEEDLE 1/2 CIR CATGUT .05X1.09 (NEEDLE) IMPLANT
NEEDLE HYPO 18GX1.5 SHARP (NEEDLE) ×1
NEEDLE MAYO TAPER (NEEDLE)
NEEDLE SCORPION MULTI FIRE (NEEDLE) IMPLANT
PACK ARTHROSCOPY DSU (CUSTOM PROCEDURE TRAY) ×3 IMPLANT
PACK BASIN DAY SURGERY FS (CUSTOM PROCEDURE TRAY) ×3 IMPLANT
PAD ALCOHOL SWAB (MISCELLANEOUS) ×6 IMPLANT
PENCIL BUTTON HOLSTER BLD 10FT (ELECTRODE) IMPLANT
PORT APPOLLO RF 90DEGREE MULTI (SURGICAL WAND) ×3 IMPLANT
RESTRAINT HEAD UNIVERSAL NS (MISCELLANEOUS) ×3 IMPLANT
SLEEVE SCD COMPRESS KNEE MED (MISCELLANEOUS) ×3 IMPLANT
SLING ARM FOAM STRAP LRG (SOFTGOODS) IMPLANT
SLING ARM FOAM STRAP MED (SOFTGOODS) ×3 IMPLANT
SLING ARM IMMOBILIZER MED (SOFTGOODS) IMPLANT
SLING ARM MED ADULT FOAM STRAP (SOFTGOODS) IMPLANT
SLING ARM XL FOAM STRAP (SOFTGOODS) IMPLANT
SLING ULTRA III MED (ORTHOPEDIC SUPPLIES) IMPLANT
SPONGE LAP 4X18 RFD (DISPOSABLE) ×3 IMPLANT
STRIP CLOSURE SKIN 1/2X4 (GAUZE/BANDAGES/DRESSINGS) IMPLANT
SUCTION FRAZIER HANDLE 10FR (MISCELLANEOUS) ×1
SUCTION TUBE FRAZIER 10FR DISP (MISCELLANEOUS) ×2 IMPLANT
SUT ETHILON 3 0 PS 1 (SUTURE) ×3 IMPLANT
SUT FIBERWIRE #2 38 T-5 BLUE (SUTURE)
SUT PDS AB 2-0 CT2 27 (SUTURE) IMPLANT
SUT PROLENE 3 0 PS 2 (SUTURE) IMPLANT
SUT TIGER TAPE 7 IN WHITE (SUTURE) IMPLANT
SUT VIC AB 0 SH 27 (SUTURE) IMPLANT
SUT VIC AB 2-0 PS2 27 (SUTURE) IMPLANT
SUT VIC AB 2-0 SH 27 (SUTURE)
SUT VIC AB 2-0 SH 27XBRD (SUTURE) IMPLANT
SUTURE FIBERWR #2 38 T-5 BLUE (SUTURE) IMPLANT
SYR 5ML LL (SYRINGE) ×3 IMPLANT
SYR BULB 3OZ (MISCELLANEOUS) IMPLANT
TAPE FIBER 2MM 7IN #2 BLUE (SUTURE) IMPLANT
TAPE HYPAFIX 6X30 (GAUZE/BANDAGES/DRESSINGS) IMPLANT
TAPE STRIPS DRAPE STRL (GAUZE/BANDAGES/DRESSINGS) ×3 IMPLANT
TOWEL GREEN STERILE FF (TOWEL DISPOSABLE) ×3 IMPLANT
TUBE CONNECTING 20X1/4 (TUBING) IMPLANT
TUBING ARTHROSCOPY IRRIG 16FT (MISCELLANEOUS) ×3 IMPLANT
WATER STERILE IRR 1000ML POUR (IV SOLUTION) ×3 IMPLANT

## 2018-08-05 NOTE — Anesthesia Procedure Notes (Signed)
Anesthesia Regional Block: Interscalene brachial plexus block   Pre-Anesthetic Checklist: ,, timeout performed, Correct Patient, Correct Site, Correct Laterality, Correct Procedure, Correct Position, site marked, Risks and benefits discussed,  Surgical consent,  Pre-op evaluation,  At surgeon's request and post-op pain management  Laterality: Right  Prep: chloraprep       Needles:  Injection technique: Single-shot  Needle Type: Echogenic Needle     Needle Length: 5cm  Needle Gauge: 21     Additional Needles:   Narrative:  Start time: 08/05/2018 10:11 AM End time: 08/05/2018 10:14 AM Injection made incrementally with aspirations every 5 mL.  Performed by: Personally  Anesthesiologist: Audry Pili, MD  Additional Notes: No pain on injection. No increased resistance to injection. Injection made in 5cc increments. Good needle visualization. Patient tolerated the procedure well.

## 2018-08-05 NOTE — Interval H&P Note (Signed)
History and Physical Interval Note:  08/05/2018 10:28 AM  Monica Houston  has presented today for surgery, with the diagnosis of RIGHT SHOULDER PRIMARY OSTEOARTHRITIS IMPINGEMENT  ROTATOR CUFF TEAR.  The various methods of treatment have been discussed with the patient and family. After consideration of risks, benefits and other options for treatment, the patient has consented to  Procedure(s) with comments: SHOULDER ARTHROSCOPY DEBRIDEMENT  WITH ROTATOR CUFF REPAIR AND SUBACROMIAL DECOMPRESSION AND DISTAL CLAVICULECTOMY (Right) - POST OP SCALENE as a surgical intervention.  The patient's history has been reviewed, patient examined, no change in status, stable for surgery.  I have reviewed the patient's chart and labs.  Questions were answered to the patient's satisfaction.     Monica Houston

## 2018-08-05 NOTE — Anesthesia Procedure Notes (Signed)
Procedure Name: Intubation Date/Time: 08/05/2018 10:41 AM Performed by: Maryella Shivers, CRNA Pre-anesthesia Checklist: Patient identified, Emergency Drugs available, Suction available and Patient being monitored Patient Re-evaluated:Patient Re-evaluated prior to induction Oxygen Delivery Method: Circle system utilized Preoxygenation: Pre-oxygenation with 100% oxygen Induction Type: IV induction Ventilation: Mask ventilation without difficulty Laryngoscope Size: Mac and 3 Grade View: Grade I Tube type: Oral Tube size: 7.0 mm Number of attempts: 1 Airway Equipment and Method: Stylet and Oral airway Placement Confirmation: ETT inserted through vocal cords under direct vision,  positive ETCO2 and breath sounds checked- equal and bilateral Secured at: 20 cm Tube secured with: Tape Dental Injury: Teeth and Oropharynx as per pre-operative assessment

## 2018-08-05 NOTE — Progress Notes (Signed)
Assisted Dr. Brock with right, ultrasound guided, interscalene  block. Side rails up, monitors on throughout procedure. See vital signs in flow sheet. Tolerated Procedure well.  

## 2018-08-05 NOTE — Op Note (Signed)
Monica Houston, Monica Houston MEDICAL RECORD DT:[id number] ACCOUNT [id number] DATE OF BIRTH:November 02, 1950 FACILITY: [facility] LOCATION: [facility] PHYSICIAN:Orson Rho Venetia Maxon, MD  OPERATIVE REPORT  DATE OF PROCEDURE:  08/05/2018  PREOPERATIVE DIAGNOSES:   1.  Right shoulder chronic traumatic partial rotator cuff tear, partial labrum tear. 2.  Right shoulder chondromalacia. 3.  Right shoulder chronic nontraumatic impingement. 4.  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  POSTOPERATIVE DIAGNOSES: 1.  Right shoulder chronic traumatic partial rotator cuff tear, partial labrum tear. 2.  Right shoulder chondromalacia. 3.  Right shoulder chronic nontraumatic impingement. 4.  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  PROCEDURE PERFORMED: 1.  Right shoulder exam under anesthesia followed by arthroscopic debridement, partial labrum tear with chondroplasty -- extensive. 2.  Right shoulder subacromial decompression. 3.  Right shoulder mini open distal clavicle excision.  SURGEON:  Elsie Saas, MD  ASSISTANT:  Matthew Saras, PA.  ANESTHESIA:  General.  OPERATIVE TIME:  One hour.  COMPLICATIONS:  None.  INDICATIONS:  The patient is a 68 year old woman who has had over a year of right shoulder pain increasing in nature.  Exam and MRI has revealed a partial rotator cuff tear, partial labrum tear with chondromalacia with impingement and AC joint  osteoarthritis and impingement.  She has failed multiple conservative modalities and is now to undergo arthroscopy.  DESCRIPTION OF PROCEDURE:  This patient was brought to the operating room on 08/05/2018 after an interscalene block was placed in the holding room by anesthesia.  She was placed on the operating table in supine position.  She received antibiotics  preoperatively for prophylaxis.  After being placed under general anesthesia, right shoulder was examined.  She had full range of motion  and her shoulder was stable ligamentous exam.  She was then placed in beach chair position and her shoulder and arm  was prepped and using sterile DuraPrep and draped using sterile technique.  Time-out procedure was called and the correct right shoulder identified.  Initially through a posterior arthroscopic portal, the arthroscope with pump attached was placed in  through an anterior portal, an arthroscopic probe was placed.  On initial inspection, the articular cartilage in the glenohumeral joint showed 75%-80% grade III chondromalacia on the glenoid and humeral head and this was debrided.  She had partial  tearing of the anterior, superior and posterior labrum 25%, which was debrided.  The anterior inferior labrum and anterior inferior glenohumeral ligament complex was intact.  The biceps tendon anchor was somewhat hypermobile, but otherwise attached.  The  biceps tendon was intact.  The rotator cuff showed a partial tear of the supraspinatus 30%-40%, which was debrided, but it was otherwise well attached and the rest of the rotator cuff was intact.  Inferior capsular recess was free of pathology.   Subacromial space was entered and a lateral arthroscopic portal was made.  A large amount of bursitis was resected.  The rotator cuff was frayed on the bursal surface, but no deep tearing was noted.  Impingement was noted as the undersurface of the  acromion was digging into the rotator cuff and a subacromial decompression was carried out removing 6-8 mm of the undersurface of the anterior, anterolateral and anteromedial acromion and CA ligament release carried out as well.  The [facility] joint has such a  large and hypertrophic spurs that a mini open technique was needed to perform a satisfactory distal clavicle excision.  Through a 3 cm longitudinal incision over the [facility] joint initial exposure was made.  Underlying subcutaneous tissues were  incised along  with skin incision.  The capsule was incised and carefully  subperiosteal dissection was carried out to the [facility] joint.  Degenerative meniscus in the [facility] joint was resected.  At this point, using an oscillating saw the distal 8-9 mm of clavicle was resected  and then superior spurring was also removed.  There were also significant spurs superiorly on the medial aspect of the acromion and these were removed as well.  After this was done, intraoperative fluoroscopy confirmed excellent distal clavicle excision.   At this point, the wound was irrigated and the capsule was closed with running 2-0 Vicryl suture, subcutaneous tissues closed with 2-0 Vicryl, subcuticular layer closed with 4-0 Prolene.  Arthroscopic portals closed with 4-0 Prolene.  Sterile dressings  were applied and a sling and the patient awakened and taken to recovery room in stable condition.  Needle and sponge counts correct x2 at the end of the case.  FOLLOWUP CARE:  This patient will be followed as an outpatient on oxycodone and tizanidine with early physical therapy.  She will be seen back in the office in a week for sutures out and followup.  TN/NUANCE  D:08/05/2018 T:08/05/2018 JOB:[id number]

## 2018-08-05 NOTE — Anesthesia Postprocedure Evaluation (Signed)
Anesthesia Post Note  Patient: Monica Houston  Procedure(s) Performed: SHOULDER ARTHROSCOPY DEBRIDEMENT  SUBACROMIAL DECOMPRESSION  CLAVICULECTOMY (Right Shoulder) DISTAL CLAVICLE RESECTION (Right Shoulder)     Patient location during evaluation: PACU Anesthesia Type: General Level of consciousness: awake and alert Pain management: pain level controlled Vital Signs Assessment: post-procedure vital signs reviewed and stable Respiratory status: spontaneous breathing, nonlabored ventilation and respiratory function stable Cardiovascular status: blood pressure returned to baseline and stable Postop Assessment: no apparent nausea or vomiting Anesthetic complications: no    Last Vitals:  Vitals:   08/05/18 1230 08/05/18 1256  BP: 135/77 (!) 141/74  Pulse: 86 87  Resp: 20 18  Temp:  36.8 C  SpO2: (!) 89% 95%    Last Pain:  Vitals:   08/05/18 1300  TempSrc:   PainSc: 0-No pain                 Audry Pili

## 2018-08-05 NOTE — Transfer of Care (Signed)
Immediate Anesthesia Transfer of Care Note  Patient: Monica Houston  Procedure(s) Performed: SHOULDER ARTHROSCOPY DEBRIDEMENT  WITH ROTATOR CUFF REPAIR AND SUBACROMIAL DECOMPRESSION  CLAVICULECTOMY (Right Shoulder) SHOULDER ARTHROSCOPY WITH OPEN ROTATOR CUFF REPAIR AND DISTAL CLAVICLE ACROMINECTOMY (Right Shoulder)  Patient Location: PACU  Anesthesia Type:GA combined with regional for post-op pain  Level of Consciousness: sedated  Airway & Oxygen Therapy: Patient Spontanous Breathing and Patient connected to nasal cannula oxygen  Post-op Assessment: Report given to RN and Post -op Vital signs reviewed and stable  Post vital signs: Reviewed and stable  Last Vitals:  Vitals Value Taken Time  BP 154/90 08/05/18 1151  Temp    Pulse 96 08/05/18 1153  Resp 24 08/05/18 1153  SpO2 97 % 08/05/18 1153  Vitals shown include unvalidated device data.  Last Pain:  Vitals:   08/05/18 0932  TempSrc: Oral  PainSc: 0-No pain         Complications: No apparent anesthesia complications

## 2018-08-05 NOTE — Discharge Instructions (Signed)
Post Anesthesia Home Care Instructions  Activity: Get plenty of rest for the remainder of the day. A responsible individual must stay with you for 24 hours following the procedure.  For the next 24 hours, DO NOT: -Drive a car -Operate machinery -Drink alcoholic beverages -Take any medication unless instructed by your physician -Make any legal decisions or sign important papers.  Meals: Start with liquid foods such as gelatin or soup. Progress to regular foods as tolerated. Avoid greasy, spicy, heavy foods. If nausea and/or vomiting occur, drink only clear liquids until the nausea and/or vomiting subsides. Call your physician if vomiting continues.  Special Instructions/Symptoms: Your throat may feel dry or sore from the anesthesia or the breathing tube placed in your throat during surgery. If this causes discomfort, gargle with warm salt water. The discomfort should disappear within 24 hours.  If you had a scopolamine patch placed behind your ear for the management of post- operative nausea and/or vomiting:  1. The medication in the patch is effective for 72 hours, after which it should be removed.  Wrap patch in a tissue and discard in the trash. Wash hands thoroughly with soap and water. 2. You may remove the patch earlier than 72 hours if you experience unpleasant side effects which may include dry mouth, dizziness or visual disturbances. 3. Avoid touching the patch. Wash your hands with soap and water after contact with the patch.      Regional Anesthesia Blocks  1. Numbness or the inability to move the "blocked" extremity may last from 3-48 hours after placement. The length of time depends on the medication injected and your individual response to the medication. If the numbness is not going away after 48 hours, call your surgeon.  2. The extremity that is blocked will need to be protected until the numbness is gone and the  Strength has returned. Because you cannot feel it, you  will need to take extra care to avoid injury. Because it may be weak, you may have difficulty moving it or using it. You may not know what position it is in without looking at it while the block is in effect.  3. For blocks in the legs and feet, returning to weight bearing and walking needs to be done carefully. You will need to wait until the numbness is entirely gone and the strength has returned. You should be able to move your leg and foot normally before you try and bear weight or walk. You will need someone to be with you when you first try to ensure you do not fall and possibly risk injury.  4. Bruising and tenderness at the needle site are common side effects and will resolve in a few days.  5. Persistent numbness or new problems with movement should be communicated to the surgeon or the [facility] ([phone])/ [facility] ([phone]).  Information for Discharge Teaching: EXPAREL (bupivacaine liposome injectable suspension)   Your surgeon or anesthesiologist gave you EXPAREL(bupivacaine) to help control your pain after surgery.   EXPAREL is a local anesthetic that provides pain relief by numbing the tissue around the surgical site.  EXPAREL is designed to release pain medication over time and can control pain for up to 72 hours.  Depending on how you respond to EXPAREL, you may require less pain medication during your recovery.  Possible side effects:  Temporary loss of sensation or ability to move in the area where bupivacaine was injected.  Nausea, vomiting, constipation  Rarely,   numbness and tingling in your mouth or lips, lightheadedness, or anxiety may occur.  Call your doctor right away if you think you may be experiencing any of these sensations, or if you have other questions regarding possible side effects.  Follow all other discharge instructions given to you by your surgeon or nurse. Eat a healthy diet and drink plenty of water or other  fluids.  If you return to the hospital for any reason within 96 hours following the administration of EXPAREL, it is important for health care providers to know that you have received this anesthetic. A teal colored band has been placed on your arm with the date, time and amount of EXPAREL you have received in order to alert and inform your health care providers. Please leave this armband in place for the full 96 hours following administration, and then you may remove the band. 

## 2018-08-05 NOTE — Anesthesia Preprocedure Evaluation (Addendum)
Anesthesia Evaluation  Patient identified by MRN, date of birth, ID band Patient awake    Reviewed: Allergy & Precautions, NPO status , Patient's Chart, lab work & pertinent test results  History of Anesthesia Complications Negative for: history of anesthetic complications  Airway Mallampati: II  TM Distance: >3 FB Neck ROM: Full    Dental  (+) Dental Advisory Given, Teeth Intact   Pulmonary neg pulmonary ROS,    breath sounds clear to auscultation       Cardiovascular hypertension, Pt. on medications  Rhythm:Regular Rate:Normal     Neuro/Psych PSYCHIATRIC DISORDERS Anxiety Depression negative neurological ROS     GI/Hepatic Neg liver ROS, GERD  Medicated and Controlled,  Endo/Other  negative endocrine ROS  Renal/GU negative Renal ROS     Musculoskeletal negative musculoskeletal ROS (+)   Abdominal   Peds  Hematology negative hematology ROS (+)   Anesthesia Other Findings   Reproductive/Obstetrics                            Anesthesia Physical Anesthesia Plan  ASA: II  Anesthesia Plan: General   Post-op Pain Management:  Regional for Post-op pain   Induction: Intravenous  PONV Risk Score and Plan: 3 and Treatment may vary due to age or medical condition, Ondansetron and Dexamethasone  Airway Management Planned: Oral ETT  Additional Equipment: None  Intra-op Plan:   Post-operative Plan: Extubation in OR  Informed Consent: I have reviewed the patients History and Physical, chart, labs and discussed the procedure including the risks, benefits and alternatives for the proposed anesthesia with the patient or authorized representative who has indicated his/her understanding and acceptance.     Dental advisory given  Plan Discussed with: CRNA and Anesthesiologist  Anesthesia Plan Comments:        Anesthesia Quick Evaluation

## 2018-08-06 ENCOUNTER — Encounter (HOSPITAL_BASED_OUTPATIENT_CLINIC_OR_DEPARTMENT_OTHER): Payer: Self-pay | Admitting: Orthopedic Surgery

## 2018-08-13 DIAGNOSIS — M62511 Muscle wasting and atrophy, not elsewhere classified, right shoulder: Secondary | ICD-10-CM | POA: Diagnosis not present

## 2018-08-13 DIAGNOSIS — M799 Soft tissue disorder, unspecified: Secondary | ICD-10-CM | POA: Diagnosis not present

## 2018-08-13 DIAGNOSIS — M25511 Pain in right shoulder: Secondary | ICD-10-CM | POA: Diagnosis not present

## 2018-08-13 DIAGNOSIS — M25611 Stiffness of right shoulder, not elsewhere classified: Secondary | ICD-10-CM | POA: Diagnosis not present

## 2018-08-17 DIAGNOSIS — M799 Soft tissue disorder, unspecified: Secondary | ICD-10-CM | POA: Diagnosis not present

## 2018-08-17 DIAGNOSIS — M25611 Stiffness of right shoulder, not elsewhere classified: Secondary | ICD-10-CM | POA: Diagnosis not present

## 2018-08-17 DIAGNOSIS — M25511 Pain in right shoulder: Secondary | ICD-10-CM | POA: Diagnosis not present

## 2018-08-17 DIAGNOSIS — M62511 Muscle wasting and atrophy, not elsewhere classified, right shoulder: Secondary | ICD-10-CM | POA: Diagnosis not present

## 2018-08-18 ENCOUNTER — Telehealth: Payer: Self-pay | Admitting: *Deleted

## 2018-08-18 DIAGNOSIS — M799 Soft tissue disorder, unspecified: Secondary | ICD-10-CM | POA: Diagnosis not present

## 2018-08-18 DIAGNOSIS — M25511 Pain in right shoulder: Secondary | ICD-10-CM | POA: Diagnosis not present

## 2018-08-18 DIAGNOSIS — M25611 Stiffness of right shoulder, not elsewhere classified: Secondary | ICD-10-CM | POA: Diagnosis not present

## 2018-08-18 DIAGNOSIS — M62511 Muscle wasting and atrophy, not elsewhere classified, right shoulder: Secondary | ICD-10-CM | POA: Diagnosis not present

## 2018-08-18 NOTE — Telephone Encounter (Signed)
LVM to call and schedule new patient appointment with Dr.Harding.

## 2018-08-21 DIAGNOSIS — M62511 Muscle wasting and atrophy, not elsewhere classified, right shoulder: Secondary | ICD-10-CM | POA: Diagnosis not present

## 2018-08-21 DIAGNOSIS — M799 Soft tissue disorder, unspecified: Secondary | ICD-10-CM | POA: Diagnosis not present

## 2018-08-21 DIAGNOSIS — M25611 Stiffness of right shoulder, not elsewhere classified: Secondary | ICD-10-CM | POA: Diagnosis not present

## 2018-08-21 DIAGNOSIS — M25511 Pain in right shoulder: Secondary | ICD-10-CM | POA: Diagnosis not present

## 2018-08-24 DIAGNOSIS — M799 Soft tissue disorder, unspecified: Secondary | ICD-10-CM | POA: Diagnosis not present

## 2018-08-24 DIAGNOSIS — M25611 Stiffness of right shoulder, not elsewhere classified: Secondary | ICD-10-CM | POA: Diagnosis not present

## 2018-08-24 DIAGNOSIS — M25511 Pain in right shoulder: Secondary | ICD-10-CM | POA: Diagnosis not present

## 2018-08-24 DIAGNOSIS — M62511 Muscle wasting and atrophy, not elsewhere classified, right shoulder: Secondary | ICD-10-CM | POA: Diagnosis not present

## 2018-08-26 DIAGNOSIS — M799 Soft tissue disorder, unspecified: Secondary | ICD-10-CM | POA: Diagnosis not present

## 2018-08-26 DIAGNOSIS — M25511 Pain in right shoulder: Secondary | ICD-10-CM | POA: Diagnosis not present

## 2018-08-26 DIAGNOSIS — M62511 Muscle wasting and atrophy, not elsewhere classified, right shoulder: Secondary | ICD-10-CM | POA: Diagnosis not present

## 2018-08-26 DIAGNOSIS — M25611 Stiffness of right shoulder, not elsewhere classified: Secondary | ICD-10-CM | POA: Diagnosis not present

## 2018-08-28 DIAGNOSIS — M799 Soft tissue disorder, unspecified: Secondary | ICD-10-CM | POA: Diagnosis not present

## 2018-08-28 DIAGNOSIS — M62511 Muscle wasting and atrophy, not elsewhere classified, right shoulder: Secondary | ICD-10-CM | POA: Diagnosis not present

## 2018-08-28 DIAGNOSIS — M25511 Pain in right shoulder: Secondary | ICD-10-CM | POA: Diagnosis not present

## 2018-08-28 DIAGNOSIS — M25611 Stiffness of right shoulder, not elsewhere classified: Secondary | ICD-10-CM | POA: Diagnosis not present

## 2018-08-31 DIAGNOSIS — M799 Soft tissue disorder, unspecified: Secondary | ICD-10-CM | POA: Diagnosis not present

## 2018-08-31 DIAGNOSIS — M25511 Pain in right shoulder: Secondary | ICD-10-CM | POA: Diagnosis not present

## 2018-08-31 DIAGNOSIS — M62511 Muscle wasting and atrophy, not elsewhere classified, right shoulder: Secondary | ICD-10-CM | POA: Diagnosis not present

## 2018-08-31 DIAGNOSIS — M25611 Stiffness of right shoulder, not elsewhere classified: Secondary | ICD-10-CM | POA: Diagnosis not present

## 2018-09-02 DIAGNOSIS — M799 Soft tissue disorder, unspecified: Secondary | ICD-10-CM | POA: Diagnosis not present

## 2018-09-02 DIAGNOSIS — M25611 Stiffness of right shoulder, not elsewhere classified: Secondary | ICD-10-CM | POA: Diagnosis not present

## 2018-09-02 DIAGNOSIS — M62511 Muscle wasting and atrophy, not elsewhere classified, right shoulder: Secondary | ICD-10-CM | POA: Diagnosis not present

## 2018-09-02 DIAGNOSIS — M25511 Pain in right shoulder: Secondary | ICD-10-CM | POA: Diagnosis not present

## 2018-09-03 NOTE — Progress Notes (Signed)
Cardiology Office Note:   Date:  09/04/2018  NAME:  Monica Houston    MRN: [id number] DOB:  21-Jan-1950   PCP:  Leighton Ruff, MD  Cardiologist:  No primary care provider on file.  Electrophysiologist:  None   Referring MD: Elsie Saas, MD   Chief Complaint  Patient presents with  . Abnormal ECG   History of Present Illness:   Monica Houston is a 68 y.o. female with a hx of anxiety, depression, GERD, hypertension who is being seen today for the evaluation of abnormal EKG at the request of Leighton Ruff, MD.  She presents today for evaluation of a reported abnormal EKG.  Review of her ECG does not demonstrate an incomplete right bundle branch block, due to her QRS duration less than 100 ms.  The pattern is consistent with a RSR pattern, which is a normal variant.  Her ECG today also reveals a prolonged QT interval, however this is incorrect as well.  Her ECG is entirely normal.  She reports a history of hypertension that is well controlled on current medications.  She reports infrequent symptoms of dizziness that occur 3-4 times per month.  They can occur when she is standing.  She reports no syncope or blackout spells.  She reports she is rather active, but does not have a formal exercise regimen.  She expresses a desire to lose weight given some recent weight gain during the coronavirus pandemic.  She is a non-smoker, has well-controlled cholesterol and blood pressure.  She reports no symptoms of chest pain chest pressure or chest tightness with exertion.  She appears to be a rather healthy individual.  Past Medical History: Past Medical History:  Diagnosis Date  . Anxiety   . Depression   . GERD (gastroesophageal reflux disease)   . Hypertension   . 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 ABDOMINAL HYSTERECTOMY    . BREAST SURGERY     breast augmentation   . CESAREAN SECTION     x2  . RESECTION DISTAL CLAVICAL Right 08/05/2018   Procedure: DISTAL CLAVICLE RESECTION;  Surgeon: Elsie Saas, MD;  Location: [facility];  Service: Orthopedics;  Laterality: Right;  . SHOULDER ARTHROSCOPY WITH SUBACROMIAL DECOMPRESSION Right 08/05/2018   Procedure: SHOULDER ARTHROSCOPY DEBRIDEMENT  SUBACROMIAL DECOMPRESSION  CLAVICULECTOMY;  Surgeon: Elsie Saas, MD;  Location: [facility];  Service: Orthopedics;  Laterality: Right;  POST OP SCALENE  . TONSILLECTOMY      Current Medications: Current Meds  Medication Sig  . ALPRAZolam (XANAX) 0.5 MG tablet Take 0.25 mg by mouth daily as needed for anxiety.   . citalopram (CELEXA) 40 MG tablet Take 40 mg by mouth daily.   Marland Kitchen ketoconazole (NIZORAL) 2 % cream Apply 1 application topically daily as needed.   . lamoTRIgine (LAMICTAL) 200 MG tablet Take 200 mg by mouth daily.   Marland Kitchen lisinopril-hydrochlorothiazide (PRINZIDE,ZESTORETIC) 10-12.5 MG tablet Take 1 tablet by mouth daily.   Marland Kitchen lovastatin (MEVACOR) 40 MG tablet Take 20 mg by mouth at bedtime.   Marland Kitchen omeprazole (PRILOSEC) 20 MG capsule Take 20 mg by mouth 2 (two) times daily before a meal.      Allergies:    Patient has no known allergies.   Social History: Social History   Socioeconomic History  . Marital status: Married    Spouse name: Not on file  . Number  of children: Not on file  . Years of education: Not on file  . Highest education level: Not on file  Occupational History  . Not on file  Social Needs  . Financial resource strain: Not on file  . Food insecurity    Worry: Not on file    Inability: Not on file  . Transportation needs    Medical: Not on file    Non-medical: Not on file  Tobacco Use  . Smoking status: Never Smoker  . Smokeless tobacco: Never Used  Substance and Sexual Activity  . Alcohol use: Yes    Comment: social  . Drug use: Never  . Sexual activity: Not on file  Lifestyle  . Physical  activity    Days per week: Not on file    Minutes per session: Not on file  . Stress: Not on file  Relationships  . Social Musician on phone: Not on file    Gets together: Not on file    Attends religious service: Not on file    Active member of club or organization: Not on file    Attends meetings of clubs or organizations: Not on file    Relationship status: Not on file  Other Topics Concern  . Not on file  Social History Narrative  . Not on file     Family History: The patient's family history includes Hypertension in her mother.  ROS:   All other ROS reviewed and negative. Pertinent positives noted in the HPI.     EKGs/Labs/Other Studies Reviewed:   The following studies were personally reviewed by me today: LDL 103, total cholesterol 197, HDL 56, triglycerides 191, creatinine 0. 79  EKG:  EKG is ordered today.  The ekg ordered today demonstrates normal sinus rhythm, heart rate 66, RSR pattern, normal intervals (of note her ECG reads as an incomplete right bundle branch block and prolonged QT, which is incorrect), and was personally reviewed by me.   Recent Labs: 08/05/2018: BUN 15; Creatinine, Ser 0.79; Potassium 3.8; Sodium 141   Recent Lipid Panel No results found for: CHOL, TRIG, HDL, CHOLHDL, VLDL, LDLCALC, LDLDIRECT  Physical Exam:   VS:  BP 118/68   Pulse 66   Temp 98.2 F (36.8 C) (Temporal)   Ht 5\' 2"  (1.575 m)   Wt 133 lb 12.8 oz (60.7 kg)   SpO2 98%   BMI 24.47 kg/m    Wt Readings from Last 3 Encounters:  09/04/18 133 lb 12.8 oz (60.7 kg)  08/05/18 132 lb 4.4 oz (60 kg)    General: Well nourished, well developed, in no acute distress Heart: Atraumatic, normal size  Eyes: PEERLA, EOMI  Neck: Supple, no JVD Endocrine: No thryomegaly Cardiac: Normal S1, S2; RRR; no murmurs, rubs, or gallops Lungs: Clear to auscultation bilaterally, no wheezing, rhonchi or rales  Abd: Soft, nontender, no hepatomegaly  Ext: No edema, pulses 2+  Musculoskeletal: No deformities, BUE and BLE strength normal and equal Skin: Warm and dry, no rashes   Neuro: Alert and oriented to person, place, time, and situation, CNII-XII grossly intact, no focal deficits  Psych: Normal mood and affect   ASSESSMENT:   NAME@ is a 68 y.o. female who presents for the following: 1. Abnormal EKG   2. Dizziness   3. Essential hypertension     PLAN:   1. Abnormal EKG -Her ECG actually is normal, demonstrating an RSR pattern.  The computer keeps reading it is an incomplete right bundle branch  block, which is not correct.  This ECG pattern is a normal variant, and associated with no increased risk of mortality or cardiovascular events.  I provided reassurance of this today.  I will go ahead and proceed with an echocardiogram to further reassure her that her heart structurally normal.  We will follow-up by phone regarding the results.  2. Dizziness -She reports infrequent symptoms of dizziness, but no episodes of syncope.  We will check a TSH today as one has not been done that I can see in the system.  Due to weight gain and sort of infrequent neurologic symptoms it could be hypothyroidism.  I have instructed her to remain hydrated and reduce stress as needed.  I do not see any reason to pursue a cardiac stress test or event monitoring giving lack of cardiac symptoms.  I will obtain echocardiogram as detailed above to ensure her that her heart is normal.  3. Essential hypertension -Well-controlled today no changes to medications  Disposition: Return if symptoms worsen or fail to improve.  Medication Adjustments/Labs and Tests Ordered: Current medicines are reviewed at length with the patient today.  Concerns regarding medicines are outlined above.  Orders Placed This Encounter  Procedures  . TSH  . EKG 12-Lead  . ECHOCARDIOGRAM COMPLETE   No orders of the defined types were placed in this encounter.   Patient Instructions  Medication Instructions:   Dr Flora Lipps'Neal recommends that you continue on your current medications as directed. Please refer to the Current Medication list given to you today.  If you need a refill on your cardiac medications before your next appointment, please call your pharmacy.   Lab work: Your physician recommends that you return for lab work TODAY.  If you have labs (blood work) drawn today and your tests are completely normal, you will receive your results only by: Marland Kitchen. MyChart Message (if you have MyChart) OR . A paper copy in the mail If you have any lab test that is abnormal or we need to change your treatment, we will call you to review the results.  Testing/Procedures: Your physician has requested that you have an echocardiogram. Echocardiography is a painless test that uses sound waves to create images of your heart. It provides your doctor with information about the size and shape of your heart and how well your heart's chambers and valves are working. This procedure takes approximately one hour. There are no restrictions for this procedure.  >> [facility] performed [facility] [address], [address] [place] [address] [phone]  Follow-Up: Dr Flora Lipps'Neal recommends that you schedule a follow-up appointment with him as needed.    Signed, Lenna GilfordWesley T. Flora Lipps'Neal, MD [facility]  [facility]  [address], [address] [place], [place] [address] [phone]  09/04/2018 4:11 PM

## 2018-09-04 ENCOUNTER — Ambulatory Visit (INDEPENDENT_AMBULATORY_CARE_PROVIDER_SITE_OTHER): Payer: Medicare Other | Admitting: Cardiovascular Disease

## 2018-09-04 ENCOUNTER — Encounter: Payer: Self-pay | Admitting: Cardiovascular Disease

## 2018-09-04 ENCOUNTER — Other Ambulatory Visit: Payer: Self-pay

## 2018-09-04 VITALS — BP 118/68 | HR 66 | Temp 98.2°F | Ht 62.0 in | Wt 133.8 lb

## 2018-09-04 DIAGNOSIS — R42 Dizziness and giddiness: Secondary | ICD-10-CM

## 2018-09-04 DIAGNOSIS — R9431 Abnormal electrocardiogram [ECG] [EKG]: Secondary | ICD-10-CM

## 2018-09-04 DIAGNOSIS — I10 Essential (primary) hypertension: Secondary | ICD-10-CM

## 2018-09-04 NOTE — Patient Instructions (Addendum)
Medication Instructions:  Dr Audie Box recommends that you continue on your current medications as directed. Please refer to the Current Medication list given to you today.  If you need a refill on your cardiac medications before your next appointment, please call your pharmacy.   Lab work: Your physician recommends that you return for lab work TODAY.  If you have labs (blood work) drawn today and your tests are completely normal, you will receive your results only by: Marland Kitchen MyChart Message (if you have MyChart) OR . A paper copy in the mail If you have any lab test that is abnormal or we need to change your treatment, we will call you to review the results.  Testing/Procedures: Your physician has requested that you have an echocardiogram. Echocardiography is a painless test that uses sound waves to create images of your heart. It provides your doctor with information about the size and shape of your heart and how well your heart's chambers and valves are working. This procedure takes approximately one hour. There are no restrictions for this procedure.  >> This will be performed at our [facility] location [place], [place] [address] [phone]  Follow-Up: Dr Audie Box recommends that you schedule a follow-up appointment with him as needed.

## 2018-09-05 LAB — TSH: TSH: 0.788 u[IU]/mL (ref 0.450–4.500)

## 2018-09-07 DIAGNOSIS — M25511 Pain in right shoulder: Secondary | ICD-10-CM | POA: Diagnosis not present

## 2018-09-07 DIAGNOSIS — M62511 Muscle wasting and atrophy, not elsewhere classified, right shoulder: Secondary | ICD-10-CM | POA: Diagnosis not present

## 2018-09-07 DIAGNOSIS — M25611 Stiffness of right shoulder, not elsewhere classified: Secondary | ICD-10-CM | POA: Diagnosis not present

## 2018-09-07 DIAGNOSIS — M799 Soft tissue disorder, unspecified: Secondary | ICD-10-CM | POA: Diagnosis not present

## 2018-09-10 ENCOUNTER — Ambulatory Visit (HOSPITAL_COMMUNITY): Payer: Medicare Other | Attending: Internal Medicine

## 2018-09-10 ENCOUNTER — Other Ambulatory Visit: Payer: Self-pay

## 2018-09-10 DIAGNOSIS — R9431 Abnormal electrocardiogram [ECG] [EKG]: Secondary | ICD-10-CM

## 2018-09-11 DIAGNOSIS — M25611 Stiffness of right shoulder, not elsewhere classified: Secondary | ICD-10-CM | POA: Diagnosis not present

## 2018-09-11 DIAGNOSIS — M62511 Muscle wasting and atrophy, not elsewhere classified, right shoulder: Secondary | ICD-10-CM | POA: Diagnosis not present

## 2018-09-11 DIAGNOSIS — M799 Soft tissue disorder, unspecified: Secondary | ICD-10-CM | POA: Diagnosis not present

## 2018-09-11 DIAGNOSIS — M25511 Pain in right shoulder: Secondary | ICD-10-CM | POA: Diagnosis not present

## 2018-09-15 DIAGNOSIS — M799 Soft tissue disorder, unspecified: Secondary | ICD-10-CM | POA: Diagnosis not present

## 2018-09-15 DIAGNOSIS — M62511 Muscle wasting and atrophy, not elsewhere classified, right shoulder: Secondary | ICD-10-CM | POA: Diagnosis not present

## 2018-09-15 DIAGNOSIS — M25511 Pain in right shoulder: Secondary | ICD-10-CM | POA: Diagnosis not present

## 2018-09-15 DIAGNOSIS — M25611 Stiffness of right shoulder, not elsewhere classified: Secondary | ICD-10-CM | POA: Diagnosis not present

## 2018-09-17 DIAGNOSIS — M25611 Stiffness of right shoulder, not elsewhere classified: Secondary | ICD-10-CM | POA: Diagnosis not present

## 2018-09-17 DIAGNOSIS — M62511 Muscle wasting and atrophy, not elsewhere classified, right shoulder: Secondary | ICD-10-CM | POA: Diagnosis not present

## 2018-09-17 DIAGNOSIS — M799 Soft tissue disorder, unspecified: Secondary | ICD-10-CM | POA: Diagnosis not present

## 2018-09-17 DIAGNOSIS — M25511 Pain in right shoulder: Secondary | ICD-10-CM | POA: Diagnosis not present

## 2018-09-23 DIAGNOSIS — M62511 Muscle wasting and atrophy, not elsewhere classified, right shoulder: Secondary | ICD-10-CM | POA: Diagnosis not present

## 2018-09-23 DIAGNOSIS — M25611 Stiffness of right shoulder, not elsewhere classified: Secondary | ICD-10-CM | POA: Diagnosis not present

## 2018-09-23 DIAGNOSIS — M799 Soft tissue disorder, unspecified: Secondary | ICD-10-CM | POA: Diagnosis not present

## 2018-09-23 DIAGNOSIS — M25511 Pain in right shoulder: Secondary | ICD-10-CM | POA: Diagnosis not present

## 2018-09-25 DIAGNOSIS — M799 Soft tissue disorder, unspecified: Secondary | ICD-10-CM | POA: Diagnosis not present

## 2018-09-25 DIAGNOSIS — M25511 Pain in right shoulder: Secondary | ICD-10-CM | POA: Diagnosis not present

## 2018-09-25 DIAGNOSIS — M62511 Muscle wasting and atrophy, not elsewhere classified, right shoulder: Secondary | ICD-10-CM | POA: Diagnosis not present

## 2018-09-25 DIAGNOSIS — M25611 Stiffness of right shoulder, not elsewhere classified: Secondary | ICD-10-CM | POA: Diagnosis not present

## 2018-09-30 DIAGNOSIS — M62511 Muscle wasting and atrophy, not elsewhere classified, right shoulder: Secondary | ICD-10-CM | POA: Diagnosis not present

## 2018-09-30 DIAGNOSIS — M799 Soft tissue disorder, unspecified: Secondary | ICD-10-CM | POA: Diagnosis not present

## 2018-09-30 DIAGNOSIS — M25511 Pain in right shoulder: Secondary | ICD-10-CM | POA: Diagnosis not present

## 2018-09-30 DIAGNOSIS — M25611 Stiffness of right shoulder, not elsewhere classified: Secondary | ICD-10-CM | POA: Diagnosis not present

## 2018-10-06 DIAGNOSIS — M62511 Muscle wasting and atrophy, not elsewhere classified, right shoulder: Secondary | ICD-10-CM | POA: Diagnosis not present

## 2018-10-06 DIAGNOSIS — M25611 Stiffness of right shoulder, not elsewhere classified: Secondary | ICD-10-CM | POA: Diagnosis not present

## 2018-10-06 DIAGNOSIS — M799 Soft tissue disorder, unspecified: Secondary | ICD-10-CM | POA: Diagnosis not present

## 2018-10-06 DIAGNOSIS — M25511 Pain in right shoulder: Secondary | ICD-10-CM | POA: Diagnosis not present

## 2018-10-09 DIAGNOSIS — M25611 Stiffness of right shoulder, not elsewhere classified: Secondary | ICD-10-CM | POA: Diagnosis not present

## 2018-10-09 DIAGNOSIS — M62511 Muscle wasting and atrophy, not elsewhere classified, right shoulder: Secondary | ICD-10-CM | POA: Diagnosis not present

## 2018-10-09 DIAGNOSIS — M25511 Pain in right shoulder: Secondary | ICD-10-CM | POA: Diagnosis not present

## 2018-10-09 DIAGNOSIS — M799 Soft tissue disorder, unspecified: Secondary | ICD-10-CM | POA: Diagnosis not present

## 2018-10-15 DIAGNOSIS — I10 Essential (primary) hypertension: Secondary | ICD-10-CM | POA: Diagnosis not present

## 2018-10-15 DIAGNOSIS — F339 Major depressive disorder, recurrent, unspecified: Secondary | ICD-10-CM | POA: Diagnosis not present

## 2018-10-15 DIAGNOSIS — E559 Vitamin D deficiency, unspecified: Secondary | ICD-10-CM | POA: Diagnosis not present

## 2018-10-15 DIAGNOSIS — E7800 Pure hypercholesterolemia, unspecified: Secondary | ICD-10-CM | POA: Diagnosis not present

## 2018-10-21 DIAGNOSIS — Z0120 Encounter for dental examination and cleaning without abnormal findings: Secondary | ICD-10-CM | POA: Diagnosis not present

## 2018-11-16 DIAGNOSIS — M8580 Other specified disorders of bone density and structure, unspecified site: Secondary | ICD-10-CM | POA: Diagnosis not present

## 2018-11-16 DIAGNOSIS — E7800 Pure hypercholesterolemia, unspecified: Secondary | ICD-10-CM | POA: Diagnosis not present

## 2018-11-16 DIAGNOSIS — F339 Major depressive disorder, recurrent, unspecified: Secondary | ICD-10-CM | POA: Diagnosis not present

## 2018-11-16 DIAGNOSIS — I10 Essential (primary) hypertension: Secondary | ICD-10-CM | POA: Diagnosis not present

## 2018-12-09 ENCOUNTER — Other Ambulatory Visit: Payer: Self-pay | Admitting: Family Medicine

## 2018-12-09 DIAGNOSIS — Z1231 Encounter for screening mammogram for malignant neoplasm of breast: Secondary | ICD-10-CM

## 2018-12-10 DIAGNOSIS — Z1211 Encounter for screening for malignant neoplasm of colon: Secondary | ICD-10-CM | POA: Diagnosis not present

## 2018-12-10 DIAGNOSIS — K219 Gastro-esophageal reflux disease without esophagitis: Secondary | ICD-10-CM | POA: Diagnosis not present

## 2019-01-07 DIAGNOSIS — Z711 Person with feared health complaint in whom no diagnosis is made: Secondary | ICD-10-CM | POA: Diagnosis not present

## 2019-02-02 DIAGNOSIS — I10 Essential (primary) hypertension: Secondary | ICD-10-CM | POA: Diagnosis not present

## 2019-02-02 DIAGNOSIS — F339 Major depressive disorder, recurrent, unspecified: Secondary | ICD-10-CM | POA: Diagnosis not present

## 2019-02-02 DIAGNOSIS — E7800 Pure hypercholesterolemia, unspecified: Secondary | ICD-10-CM | POA: Diagnosis not present

## 2019-02-02 DIAGNOSIS — M8580 Other specified disorders of bone density and structure, unspecified site: Secondary | ICD-10-CM | POA: Diagnosis not present

## 2019-02-03 ENCOUNTER — Ambulatory Visit: Payer: Medicare Other

## 2019-02-11 DIAGNOSIS — E7800 Pure hypercholesterolemia, unspecified: Secondary | ICD-10-CM | POA: Diagnosis not present

## 2019-02-11 DIAGNOSIS — M8580 Other specified disorders of bone density and structure, unspecified site: Secondary | ICD-10-CM | POA: Diagnosis not present

## 2019-02-11 DIAGNOSIS — I10 Essential (primary) hypertension: Secondary | ICD-10-CM | POA: Diagnosis not present

## 2019-02-11 DIAGNOSIS — F339 Major depressive disorder, recurrent, unspecified: Secondary | ICD-10-CM | POA: Diagnosis not present

## 2019-03-05 DIAGNOSIS — M79674 Pain in right toe(s): Secondary | ICD-10-CM | POA: Diagnosis not present

## 2019-03-08 ENCOUNTER — Other Ambulatory Visit: Payer: Self-pay

## 2019-03-08 ENCOUNTER — Ambulatory Visit
Admission: RE | Admit: 2019-03-08 | Discharge: 2019-03-08 | Disposition: A | Discharge status: Home / Self Care | Payer: Medicare Other | Source: Ambulatory Visit | Attending: Family Medicine | Admitting: Family Medicine

## 2019-03-08 DIAGNOSIS — Z1231 Encounter for screening mammogram for malignant neoplasm of breast: Secondary | ICD-10-CM | POA: Diagnosis not present

## 2019-03-10 DIAGNOSIS — M2041 Other hammer toe(s) (acquired), right foot: Secondary | ICD-10-CM | POA: Diagnosis not present

## 2019-03-12 DIAGNOSIS — L97511 Non-pressure chronic ulcer of other part of right foot limited to breakdown of skin: Secondary | ICD-10-CM | POA: Diagnosis not present

## 2019-03-17 DIAGNOSIS — L97511 Non-pressure chronic ulcer of other part of right foot limited to breakdown of skin: Secondary | ICD-10-CM | POA: Diagnosis not present

## 2019-03-19 DIAGNOSIS — I10 Essential (primary) hypertension: Secondary | ICD-10-CM | POA: Diagnosis not present

## 2019-03-19 DIAGNOSIS — M8580 Other specified disorders of bone density and structure, unspecified site: Secondary | ICD-10-CM | POA: Diagnosis not present

## 2019-03-19 DIAGNOSIS — F411 Generalized anxiety disorder: Secondary | ICD-10-CM | POA: Diagnosis not present

## 2019-03-19 DIAGNOSIS — E559 Vitamin D deficiency, unspecified: Secondary | ICD-10-CM | POA: Diagnosis not present

## 2019-04-14 ENCOUNTER — Encounter: Payer: Self-pay | Admitting: Podiatry

## 2019-04-14 ENCOUNTER — Other Ambulatory Visit: Payer: Self-pay

## 2019-04-14 ENCOUNTER — Ambulatory Visit: Payer: Medicare Other | Admitting: Podiatry

## 2019-04-14 ENCOUNTER — Other Ambulatory Visit: Payer: Self-pay | Admitting: Podiatry

## 2019-04-14 ENCOUNTER — Ambulatory Visit (INDEPENDENT_AMBULATORY_CARE_PROVIDER_SITE_OTHER): Payer: Medicare Other

## 2019-04-14 VITALS — Temp 97.6°F

## 2019-04-14 DIAGNOSIS — L84 Corns and callosities: Secondary | ICD-10-CM

## 2019-04-14 DIAGNOSIS — M2040 Other hammer toe(s) (acquired), unspecified foot: Secondary | ICD-10-CM | POA: Diagnosis not present

## 2019-04-14 DIAGNOSIS — M79671 Pain in right foot: Secondary | ICD-10-CM

## 2019-04-14 NOTE — Patient Instructions (Signed)
Pre-Operative Instructions  Congratulations, you have decided to take an important step towards improving your quality of life.  You can be assured that the doctors and staff at [facility] will be with you every step of the way.  Here are some important things you should know:  1. Plan to be at the surgery center/hospital at least 1 (one) hour prior to your scheduled time, unless otherwise directed by the surgical center/hospital staff.  You must have a responsible adult accompany you, remain during the surgery and drive you home.  Make sure you have directions to the surgical center/hospital to ensure you arrive on time. 2. If you are having surgery at [facility], you will need a copy of your medical history and physical form from your family physician within one month prior to the date of surgery. We will give you a form for your primary physician to complete.  3. We make every effort to accommodate the date you request for surgery.  However, there are times where surgery dates or times have to be moved.  We will contact you as soon as possible if a change in schedule is required.   4. No aspirin/ibuprofen for one week before surgery.  If you are on aspirin, any non-steroidal anti-inflammatory medications (Mobic, Aleve, Ibuprofen) should not be taken seven (7) days prior to your surgery.  You make take Tylenol for pain prior to surgery.  5. Medications - If you are taking daily heart and blood pressure medications, seizure, reflux, allergy, asthma, anxiety, pain or diabetes medications, make sure you notify the surgery center/hospital before the day of surgery so they can tell you which medications you should take or avoid the day of surgery. 6. No food or drink after midnight the night before surgery unless directed otherwise by surgical center/hospital staff. 7. No alcoholic beverages 24-hours prior to surgery.  No smoking 24-hours prior or 24-hours after  surgery. 8. Wear loose pants or shorts. They should be loose enough to fit over bandages, boots, and casts. 9. Don't wear slip-on shoes. Sneakers are preferred. 10. Bring your boot with you to the surgery center/hospital.  Also bring crutches or a walker if your physician has prescribed it for you.  If you do not have this equipment, it will be provided for you after surgery. 11. If you have not been contacted by the surgery center/hospital by the day before your surgery, call to confirm the date and time of your surgery. 12. Leave-time from work may vary depending on the type of surgery you have.  Appropriate arrangements should be made prior to surgery with your employer. 13. Prescriptions will be provided immediately following surgery by your doctor.  Fill these as soon as possible after surgery and take the medication as directed. Pain medications will not be refilled on weekends and must be approved by the doctor. 14. Remove nail polish on the operative foot and avoid getting pedicures prior to surgery. 15. Wash the night before surgery.  The night before surgery wash the foot and leg well with water and the antibacterial soap provided. Be sure to pay special attention to beneath the toenails and in between the toes.  Wash for at least three (3) minutes. Rinse thoroughly with water and dry well with a towel.  Perform this wash unless told not to do so by your physician.  Enclosed: 1 Ice pack (please put in freezer the night before surgery)   1 Hibiclens skin cleaner     Pre-op instructions  If you have any questions regarding the instructions, please do not hesitate to call our office.  [place]: [address], [place], [place] [address] -- [phone]  [place]: [address] [place], [place] [address] -- [phone]  [place]: [address], [place], [place] [address] -- [phone]   Website: [email or web address] 

## 2019-04-16 NOTE — Progress Notes (Signed)
   HPI: 69 y.o. female presenting today, referred by Dr. Elijah Birk, with a chief complaint of pain between the 4th and 5th digits of the right foot that has been ongoing for the past several years. She states the burning sensation is present regardless if she is wearing shoes or not. She denies any modifying factors. She has been treating conservatively for the past several months. Patient is here for further evaluation and treatment.   Past Medical History:  Diagnosis Date  . Anxiety   . Depression   . GERD (gastroesophageal reflux disease)   . Hypertension   . 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  Dermatology: Hyperkeratotic tissue noted to the 4th interdigital webspace of the right foot. Skin is cool, dry and supple bilateral lower extremities. Negative for open lesions or macerations.  Vascular: Palpable pedal pulses bilaterally. No edema or erythema noted. Capillary refill within normal limits.  Neurological: Epicritic and protective threshold grossly intact bilaterally.   Musculoskeletal Exam: All pedal and ankle joints range of motion within normal limits bilateral. Muscle strength 5/5 in all groups bilateral. Hammertoe contracture deformity noted to the 5th digit of the right foot.  Radiographic Exam: Hammertoe contracture deformity noted to the interphalangeal joints and MPJ of the respective hammertoe digits mentioned on clinical musculoskeletal exam.     Assessment: 1. Heloma molle right 2. 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 Patient evaluated. X-Rays reviewed.  2. Today we discussed the conservative versus surgical management of the presenting pathology. The patient opts for surgical management. All possible complications and details of the procedure were explained. All patient questions were  answered. No guarantees were expressed or implied. 3. Authorization for in-office surgery was initiated today. Surgery will consist of PIPJ arthroplasty right 5th toe.  4. Return to clinic one week post op.   Retired 2 years ago from [employer].     Felecia Shelling, DPM [facility]  Dr. Felecia Shelling, DPM    [address] [place], [place] [address]                Office [phone]  Fax [phone]

## 2019-04-21 DIAGNOSIS — F411 Generalized anxiety disorder: Secondary | ICD-10-CM | POA: Diagnosis not present

## 2019-04-21 DIAGNOSIS — I10 Essential (primary) hypertension: Secondary | ICD-10-CM | POA: Diagnosis not present

## 2019-04-21 DIAGNOSIS — E559 Vitamin D deficiency, unspecified: Secondary | ICD-10-CM | POA: Diagnosis not present

## 2019-04-21 DIAGNOSIS — M8580 Other specified disorders of bone density and structure, unspecified site: Secondary | ICD-10-CM | POA: Diagnosis not present

## 2019-04-27 DIAGNOSIS — Z0120 Encounter for dental examination and cleaning without abnormal findings: Secondary | ICD-10-CM | POA: Diagnosis not present

## 2019-05-05 ENCOUNTER — Other Ambulatory Visit: Payer: Self-pay

## 2019-05-05 ENCOUNTER — Encounter: Payer: Self-pay | Admitting: Podiatry

## 2019-05-05 ENCOUNTER — Ambulatory Visit: Payer: Medicare Other | Admitting: Podiatry

## 2019-05-05 DIAGNOSIS — M2041 Other hammer toe(s) (acquired), right foot: Secondary | ICD-10-CM

## 2019-05-05 DIAGNOSIS — M2040 Other hammer toe(s) (acquired), unspecified foot: Secondary | ICD-10-CM

## 2019-05-05 MED ORDER — HYDROCODONE-ACETAMINOPHEN 5-325 MG PO TABS: 1.0000 | ORAL_TABLET | Freq: Four times a day (QID) | ORAL | 0 refills | Status: DC | PRN

## 2019-05-05 NOTE — Progress Notes (Signed)
   OPERATIVE REPORT Patient name: Monica Houston MRN: [id number] DOB: 07-15-1950  DOS:  05/05/19  Preop Dx: Lance Morin RT 5th toe. Heloma Molle RT Postop Dx: same  Procedure:  1. PIPJ arthroplasty 5th toe RT  Surgeon: Felecia Shelling DPM  Anesthesia: 2% lidocaine plain 27mL. 2% lidocaine w/ epi 46mL  Hemostasis: None   EBL: minimal mL Materials: none Injectables: none Pathology: none  Condition: The patient tolerated the procedure and anesthesia well. No complications noted or reported   Justification for procedure: The patient is a 69 y.o. female who presents today for surgical correction of symptomatic hammertoe with heloma molle right fifth digit. All conservative modalities of been unsuccessful in providing any sort of satisfactory alleviation of symptoms with the patient. The patient was told benefits as well as possible side effects of the surgery. The patient consented for surgical correction. The patient consent form was reviewed. All patient questions were answered. No guarantees were expressed or implied.   Procedure in Detail: The patient was brought to the procedure room, placed in the procedure chair in the supine position at which time an aseptic scrub and drape were performed about the patient's respective lower extremity after anesthesia was induced as described above. Attention was then directed to the surgical area where procedure number one commenced.  Procedure #1: PIPJ arthroplasty fifth digit right foot  1.5 cm elliptical incision planned and made overlying the PIPJ of the right fifth digit.  Ellipsed wedge of skin removed.  Transverse tenotomy performed of the EDL tendon to expose the underlying hypertrophic head of the proximal phalanx.  Soft tissue dissection achieved to expose the hypertrophic head.  Bone cutter utilized to remove the head at the surgical phalangeal neck.  Irrigation utilized and 4-0 Vicryl suture utilized to reapproximate the opposing ends of the  EDL tendon followed by 4-0 nylon to reapproximate superficial skin edges  Dry sterile compressive dressings were then applied to all previously mentioned incision sites about the patient's lower extremity.   The patient was then discharged with adequate prescriptions for analgesia. Verbal as well as written instructions were provided for the patient regarding wound care. The patient is to keep the dressings clean dry and intact until they are to follow surgeon Dr. Gala Lewandowsky in the office upon discharge.   Felecia Shelling, DPM [facility]  Dr. Felecia Shelling, DPM    [address]                                        [place], [place] [address]                Office [phone]  Fax [phone]

## 2019-05-05 NOTE — Addendum Note (Signed)
Addended by: Felecia Shelling on: 05/05/2019 08:11 AM   Modules accepted: Orders

## 2019-05-12 ENCOUNTER — Other Ambulatory Visit: Payer: Self-pay

## 2019-05-12 ENCOUNTER — Ambulatory Visit (INDEPENDENT_AMBULATORY_CARE_PROVIDER_SITE_OTHER): Payer: Medicare Other | Admitting: Podiatry

## 2019-05-12 ENCOUNTER — Ambulatory Visit (INDEPENDENT_AMBULATORY_CARE_PROVIDER_SITE_OTHER): Payer: Medicare Other

## 2019-05-12 DIAGNOSIS — M2041 Other hammer toe(s) (acquired), right foot: Secondary | ICD-10-CM | POA: Diagnosis not present

## 2019-05-12 DIAGNOSIS — Z9889 Other specified postprocedural states: Secondary | ICD-10-CM

## 2019-05-17 DIAGNOSIS — I10 Essential (primary) hypertension: Secondary | ICD-10-CM | POA: Diagnosis not present

## 2019-05-17 DIAGNOSIS — E7800 Pure hypercholesterolemia, unspecified: Secondary | ICD-10-CM | POA: Diagnosis not present

## 2019-05-17 DIAGNOSIS — M8580 Other specified disorders of bone density and structure, unspecified site: Secondary | ICD-10-CM | POA: Diagnosis not present

## 2019-05-17 DIAGNOSIS — F339 Major depressive disorder, recurrent, unspecified: Secondary | ICD-10-CM | POA: Diagnosis not present

## 2019-05-18 NOTE — Progress Notes (Signed)
   Subjective:  Patient presents today status post 5th toe arthroplasty right. DOS: 05/05/2019. She states she is doing well. She denies any significant pain or modifying factors. She has been using the post op shoe as directed. She states the foot got wet in the shower last night but denies any concerns or problems. Patient is here for further evaluation and treatment.    Past Medical History:  Diagnosis Date  . Anxiety   . Depression   . GERD (gastroesophageal reflux disease)   . Hypertension   . 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  Skin incisions appear to be well coapted with sutures and staples intact. No sign of infectious process noted. No dehiscence. No active bleeding noted. Moderate edema noted to the surgical extremity.  Radiographic Exam:  Osteotomies sites appear to be stable with routine healing.  Assessment: 1. s/p 5th toe arthroplasty right. DOS: 05/05/2019   Plan of Care:  1. Patient was evaluated. X-rays reviewed 2. Dressing changed.  3. Discontinue using post op shoe.  4. Return to clinic in one week for suture removal.    Felecia Shelling, DPM [facility]  Dr. Felecia Shelling, DPM    [address]                                        [place], [place] [address]                Office [phone]  Fax [phone]

## 2019-05-24 ENCOUNTER — Ambulatory Visit (INDEPENDENT_AMBULATORY_CARE_PROVIDER_SITE_OTHER): Payer: Medicare Other | Admitting: Podiatry

## 2019-05-24 ENCOUNTER — Other Ambulatory Visit: Payer: Self-pay

## 2019-05-24 ENCOUNTER — Encounter: Payer: Self-pay | Admitting: Podiatry

## 2019-05-24 DIAGNOSIS — M2041 Other hammer toe(s) (acquired), right foot: Secondary | ICD-10-CM

## 2019-05-24 DIAGNOSIS — Z9889 Other specified postprocedural states: Secondary | ICD-10-CM

## 2019-05-26 NOTE — Progress Notes (Signed)
   Subjective:  Patient presents today status post 5th toe arthroplasty right. DOS: 05/05/2019. She states she is doing well. She reports some intermittent mild soreness. She notes some intermittent associated swelling. She has been wearing good shoe gear as instructed. There are no modifying factors noted at this time. Patient is here for further evaluation and treatment.    Past Medical History:  Diagnosis Date  . Anxiety   . Depression   . GERD (gastroesophageal reflux disease)   . Hypertension   . 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  Skin incisions appear to be well coapted. No sign of infectious process noted. No dehiscence. No active bleeding noted. Moderate edema noted to the surgical extremity.  Assessment: 1. s/p 5th toe arthroplasty right. DOS: 05/05/2019   Plan of Care:  1. Patient was evaluated. 2. May resume full activity with no restrictions.  3. Recommended good shoe gear.  4. Return to clinic as needed.    Felecia Shelling, DPM [facility]  Dr. Felecia Shelling, DPM    [address]                                        [place], [place] [address]                Office [phone]  Fax [phone]

## 2019-06-04 DIAGNOSIS — I10 Essential (primary) hypertension: Secondary | ICD-10-CM | POA: Diagnosis not present

## 2019-06-09 ENCOUNTER — Encounter: Payer: Medicare Other | Admitting: Podiatry

## 2019-07-06 DIAGNOSIS — I10 Essential (primary) hypertension: Secondary | ICD-10-CM | POA: Diagnosis not present

## 2019-07-07 DIAGNOSIS — I10 Essential (primary) hypertension: Secondary | ICD-10-CM | POA: Diagnosis not present

## 2019-07-19 ENCOUNTER — Other Ambulatory Visit: Payer: Self-pay | Admitting: Family Medicine

## 2019-07-19 DIAGNOSIS — S62316A Displaced fracture of base of fifth metacarpal bone, right hand, initial encounter for closed fracture: Secondary | ICD-10-CM | POA: Diagnosis not present

## 2019-07-19 DIAGNOSIS — S0990XA Unspecified injury of head, initial encounter: Secondary | ICD-10-CM | POA: Diagnosis not present

## 2019-07-19 DIAGNOSIS — S62326A Displaced fracture of shaft of fifth metacarpal bone, right hand, initial encounter for closed fracture: Secondary | ICD-10-CM | POA: Diagnosis not present

## 2019-07-19 DIAGNOSIS — S6991XA Unspecified injury of right wrist, hand and finger(s), initial encounter: Secondary | ICD-10-CM | POA: Diagnosis not present

## 2019-07-19 DIAGNOSIS — S0990XS Unspecified injury of head, sequela: Secondary | ICD-10-CM

## 2019-07-19 DIAGNOSIS — M542 Cervicalgia: Secondary | ICD-10-CM | POA: Diagnosis not present

## 2019-07-20 ENCOUNTER — Other Ambulatory Visit: Payer: Self-pay

## 2019-07-20 ENCOUNTER — Other Ambulatory Visit: Payer: Self-pay | Admitting: Family Medicine

## 2019-07-20 ENCOUNTER — Encounter (HOSPITAL_COMMUNITY): Payer: Self-pay

## 2019-07-20 ENCOUNTER — Ambulatory Visit (HOSPITAL_COMMUNITY)
Admission: RE | Admit: 2019-07-20 | Discharge: 2019-07-20 | Disposition: A | Discharge status: Home / Self Care | Payer: Medicare Other | Source: Ambulatory Visit | Attending: Family Medicine | Admitting: Family Medicine

## 2019-07-20 DIAGNOSIS — S0990XS Unspecified injury of head, sequela: Secondary | ICD-10-CM

## 2019-07-20 DIAGNOSIS — S0990XA Unspecified injury of head, initial encounter: Secondary | ICD-10-CM | POA: Diagnosis not present

## 2019-07-20 DIAGNOSIS — S199XXA Unspecified injury of neck, initial encounter: Secondary | ICD-10-CM | POA: Diagnosis not present

## 2019-08-05 DIAGNOSIS — I10 Essential (primary) hypertension: Secondary | ICD-10-CM | POA: Diagnosis not present

## 2019-08-06 DIAGNOSIS — I10 Essential (primary) hypertension: Secondary | ICD-10-CM | POA: Diagnosis not present

## 2019-08-19 DIAGNOSIS — S62316D Displaced fracture of base of fifth metacarpal bone, right hand, subsequent encounter for fracture with routine healing: Secondary | ICD-10-CM | POA: Diagnosis not present

## 2019-09-06 DIAGNOSIS — I10 Essential (primary) hypertension: Secondary | ICD-10-CM | POA: Diagnosis not present

## 2019-09-07 DIAGNOSIS — I10 Essential (primary) hypertension: Secondary | ICD-10-CM | POA: Diagnosis not present

## 2019-09-07 DIAGNOSIS — E7800 Pure hypercholesterolemia, unspecified: Secondary | ICD-10-CM | POA: Diagnosis not present

## 2019-09-07 DIAGNOSIS — K219 Gastro-esophageal reflux disease without esophagitis: Secondary | ICD-10-CM | POA: Diagnosis not present

## 2019-09-07 DIAGNOSIS — F339 Major depressive disorder, recurrent, unspecified: Secondary | ICD-10-CM | POA: Diagnosis not present

## 2019-09-16 DIAGNOSIS — S62316D Displaced fracture of base of fifth metacarpal bone, right hand, subsequent encounter for fracture with routine healing: Secondary | ICD-10-CM | POA: Diagnosis not present

## 2019-10-07 DIAGNOSIS — I10 Essential (primary) hypertension: Secondary | ICD-10-CM | POA: Diagnosis not present

## 2019-10-22 DIAGNOSIS — M19041 Primary osteoarthritis, right hand: Secondary | ICD-10-CM | POA: Diagnosis not present

## 2019-11-02 DIAGNOSIS — S62316D Displaced fracture of base of fifth metacarpal bone, right hand, subsequent encounter for fracture with routine healing: Secondary | ICD-10-CM | POA: Diagnosis not present

## 2019-11-02 DIAGNOSIS — M19041 Primary osteoarthritis, right hand: Secondary | ICD-10-CM | POA: Diagnosis not present

## 2019-11-03 DIAGNOSIS — I10 Essential (primary) hypertension: Secondary | ICD-10-CM | POA: Diagnosis not present

## 2019-11-10 DIAGNOSIS — M19041 Primary osteoarthritis, right hand: Secondary | ICD-10-CM | POA: Diagnosis not present

## 2019-11-12 ENCOUNTER — Other Ambulatory Visit: Payer: Self-pay

## 2019-11-12 ENCOUNTER — Inpatient Hospital Stay: Payer: Medicare Other | Attending: Radiation Oncology

## 2019-11-12 DIAGNOSIS — Z23 Encounter for immunization: Secondary | ICD-10-CM

## 2019-11-12 NOTE — Progress Notes (Signed)
   Covid-19 Vaccination Clinic  Name:  Monica Houston    MRN: [id number] DOB: 09-26-1950  11/12/2019  Monica Houston was observed post Covid-19 immunization for 15 minutes without incident. She was provided with Vaccine Information Sheet and instruction to access the V-Safe system.   Monica Houston was instructed to call 911 with any severe reactions post vaccine: Marland Kitchen Difficulty breathing  . Swelling of face and throat  . A fast heartbeat  . A bad rash all over body  . Dizziness and weakness

## 2019-11-24 DIAGNOSIS — M19041 Primary osteoarthritis, right hand: Secondary | ICD-10-CM | POA: Diagnosis not present

## 2019-12-06 DIAGNOSIS — I10 Essential (primary) hypertension: Secondary | ICD-10-CM | POA: Diagnosis not present

## 2019-12-07 DIAGNOSIS — E559 Vitamin D deficiency, unspecified: Secondary | ICD-10-CM | POA: Diagnosis not present

## 2019-12-07 DIAGNOSIS — E7800 Pure hypercholesterolemia, unspecified: Secondary | ICD-10-CM | POA: Diagnosis not present

## 2019-12-07 DIAGNOSIS — Z0000 Encounter for general adult medical examination without abnormal findings: Secondary | ICD-10-CM | POA: Diagnosis not present

## 2019-12-07 DIAGNOSIS — I10 Essential (primary) hypertension: Secondary | ICD-10-CM | POA: Diagnosis not present

## 2019-12-07 DIAGNOSIS — M19041 Primary osteoarthritis, right hand: Secondary | ICD-10-CM | POA: Diagnosis not present

## 2019-12-22 DIAGNOSIS — M19041 Primary osteoarthritis, right hand: Secondary | ICD-10-CM | POA: Diagnosis not present

## 2020-01-06 DIAGNOSIS — I10 Essential (primary) hypertension: Secondary | ICD-10-CM | POA: Diagnosis not present

## 2020-01-11 DIAGNOSIS — R799 Abnormal finding of blood chemistry, unspecified: Secondary | ICD-10-CM | POA: Diagnosis not present

## 2020-01-11 DIAGNOSIS — H9319 Tinnitus, unspecified ear: Secondary | ICD-10-CM | POA: Diagnosis not present

## 2020-01-11 DIAGNOSIS — F3341 Major depressive disorder, recurrent, in partial remission: Secondary | ICD-10-CM | POA: Diagnosis not present

## 2020-01-12 DIAGNOSIS — M19041 Primary osteoarthritis, right hand: Secondary | ICD-10-CM | POA: Diagnosis not present

## 2020-01-18 DIAGNOSIS — M19041 Primary osteoarthritis, right hand: Secondary | ICD-10-CM | POA: Diagnosis not present

## 2020-02-17 DIAGNOSIS — M19041 Primary osteoarthritis, right hand: Secondary | ICD-10-CM | POA: Diagnosis not present

## 2020-02-23 DIAGNOSIS — M85851 Other specified disorders of bone density and structure, right thigh: Secondary | ICD-10-CM | POA: Diagnosis not present

## 2020-02-23 DIAGNOSIS — Z1231 Encounter for screening mammogram for malignant neoplasm of breast: Secondary | ICD-10-CM | POA: Diagnosis not present

## 2020-02-23 DIAGNOSIS — M85852 Other specified disorders of bone density and structure, left thigh: Secondary | ICD-10-CM | POA: Diagnosis not present

## 2020-03-06 DIAGNOSIS — K219 Gastro-esophageal reflux disease without esophagitis: Secondary | ICD-10-CM | POA: Diagnosis not present

## 2020-03-06 DIAGNOSIS — I10 Essential (primary) hypertension: Secondary | ICD-10-CM | POA: Diagnosis not present

## 2020-03-06 DIAGNOSIS — F339 Major depressive disorder, recurrent, unspecified: Secondary | ICD-10-CM | POA: Diagnosis not present

## 2020-03-06 DIAGNOSIS — E7800 Pure hypercholesterolemia, unspecified: Secondary | ICD-10-CM | POA: Diagnosis not present

## 2020-03-13 DIAGNOSIS — H524 Presbyopia: Secondary | ICD-10-CM | POA: Diagnosis not present

## 2020-03-27 DIAGNOSIS — M8580 Other specified disorders of bone density and structure, unspecified site: Secondary | ICD-10-CM | POA: Diagnosis not present

## 2020-03-27 DIAGNOSIS — E7800 Pure hypercholesterolemia, unspecified: Secondary | ICD-10-CM | POA: Diagnosis not present

## 2020-03-27 DIAGNOSIS — K219 Gastro-esophageal reflux disease without esophagitis: Secondary | ICD-10-CM | POA: Diagnosis not present

## 2020-03-27 DIAGNOSIS — I10 Essential (primary) hypertension: Secondary | ICD-10-CM | POA: Diagnosis not present

## 2020-04-04 DIAGNOSIS — M545 Low back pain, unspecified: Secondary | ICD-10-CM | POA: Diagnosis not present

## 2020-04-07 DIAGNOSIS — K219 Gastro-esophageal reflux disease without esophagitis: Secondary | ICD-10-CM | POA: Diagnosis not present

## 2020-04-07 DIAGNOSIS — Z1211 Encounter for screening for malignant neoplasm of colon: Secondary | ICD-10-CM | POA: Diagnosis not present

## 2020-04-18 DIAGNOSIS — M5416 Radiculopathy, lumbar region: Secondary | ICD-10-CM | POA: Diagnosis not present

## 2020-04-27 DIAGNOSIS — M5441 Lumbago with sciatica, right side: Secondary | ICD-10-CM | POA: Diagnosis not present

## 2020-05-19 DIAGNOSIS — M5459 Other low back pain: Secondary | ICD-10-CM | POA: Diagnosis not present

## 2020-05-23 DIAGNOSIS — E7800 Pure hypercholesterolemia, unspecified: Secondary | ICD-10-CM | POA: Diagnosis not present

## 2020-05-23 DIAGNOSIS — I10 Essential (primary) hypertension: Secondary | ICD-10-CM | POA: Diagnosis not present

## 2020-05-23 DIAGNOSIS — K219 Gastro-esophageal reflux disease without esophagitis: Secondary | ICD-10-CM | POA: Diagnosis not present

## 2020-05-23 DIAGNOSIS — F339 Major depressive disorder, recurrent, unspecified: Secondary | ICD-10-CM | POA: Diagnosis not present

## 2020-06-14 DIAGNOSIS — M5459 Other low back pain: Secondary | ICD-10-CM | POA: Diagnosis not present

## 2020-06-27 DIAGNOSIS — M5416 Radiculopathy, lumbar region: Secondary | ICD-10-CM | POA: Diagnosis not present

## 2020-06-27 DIAGNOSIS — M5459 Other low back pain: Secondary | ICD-10-CM | POA: Diagnosis not present

## 2020-07-03 DIAGNOSIS — F339 Major depressive disorder, recurrent, unspecified: Secondary | ICD-10-CM | POA: Diagnosis not present

## 2020-07-03 DIAGNOSIS — E7800 Pure hypercholesterolemia, unspecified: Secondary | ICD-10-CM | POA: Diagnosis not present

## 2020-07-03 DIAGNOSIS — I10 Essential (primary) hypertension: Secondary | ICD-10-CM | POA: Diagnosis not present

## 2020-07-03 DIAGNOSIS — K219 Gastro-esophageal reflux disease without esophagitis: Secondary | ICD-10-CM | POA: Diagnosis not present

## 2020-07-13 DIAGNOSIS — I10 Essential (primary) hypertension: Secondary | ICD-10-CM | POA: Diagnosis not present

## 2020-07-13 DIAGNOSIS — F339 Major depressive disorder, recurrent, unspecified: Secondary | ICD-10-CM | POA: Diagnosis not present

## 2020-07-13 DIAGNOSIS — K219 Gastro-esophageal reflux disease without esophagitis: Secondary | ICD-10-CM | POA: Diagnosis not present

## 2020-07-13 DIAGNOSIS — E7800 Pure hypercholesterolemia, unspecified: Secondary | ICD-10-CM | POA: Diagnosis not present

## 2020-08-30 DIAGNOSIS — M5416 Radiculopathy, lumbar region: Secondary | ICD-10-CM | POA: Diagnosis not present

## 2020-10-03 DIAGNOSIS — M5416 Radiculopathy, lumbar region: Secondary | ICD-10-CM | POA: Diagnosis not present

## 2020-11-13 DIAGNOSIS — F411 Generalized anxiety disorder: Secondary | ICD-10-CM | POA: Diagnosis not present

## 2020-11-13 DIAGNOSIS — F339 Major depressive disorder, recurrent, unspecified: Secondary | ICD-10-CM | POA: Diagnosis not present

## 2020-12-05 DIAGNOSIS — I10 Essential (primary) hypertension: Secondary | ICD-10-CM | POA: Diagnosis not present

## 2020-12-05 DIAGNOSIS — F339 Major depressive disorder, recurrent, unspecified: Secondary | ICD-10-CM | POA: Diagnosis not present

## 2020-12-05 DIAGNOSIS — K219 Gastro-esophageal reflux disease without esophagitis: Secondary | ICD-10-CM | POA: Diagnosis not present

## 2020-12-05 DIAGNOSIS — E7800 Pure hypercholesterolemia, unspecified: Secondary | ICD-10-CM | POA: Diagnosis not present

## 2020-12-11 DIAGNOSIS — F339 Major depressive disorder, recurrent, unspecified: Secondary | ICD-10-CM | POA: Diagnosis not present

## 2020-12-11 DIAGNOSIS — E7800 Pure hypercholesterolemia, unspecified: Secondary | ICD-10-CM | POA: Diagnosis not present

## 2020-12-11 DIAGNOSIS — Z0000 Encounter for general adult medical examination without abnormal findings: Secondary | ICD-10-CM | POA: Diagnosis not present

## 2020-12-11 DIAGNOSIS — I10 Essential (primary) hypertension: Secondary | ICD-10-CM | POA: Diagnosis not present

## 2020-12-17 DIAGNOSIS — Z1211 Encounter for screening for malignant neoplasm of colon: Secondary | ICD-10-CM | POA: Diagnosis not present

## 2021-01-22 IMAGING — CT CT HEAD W/O CM
3 series · 14 of 47 positions shown, 16 images · non-contrast
Comparison: None.

CLINICAL DATA: Neck pain and bruising about the right eye since a
fall 2 days ago. Initial encounter.

EXAM:
CT HEAD WITHOUT CONTRAST
CT CERVICAL SPINE WITHOUT CONTRAST
TECHNIQUE: Multidetector CT imaging of the head and cervical spine was
performed following the standard protocol without intravenous
contrast. Multiplanar CT image reconstructions of the cervical spine
were also generated.

[Series 2: head wo · axial · 0.47mm/px · z∈[-106,+19]mm · 8 of 30 slices shown, 10 images]
[im 3/30  brain]
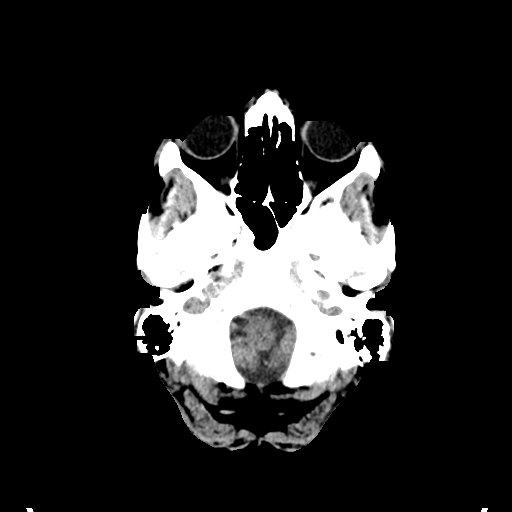
[im 3/30  bone]
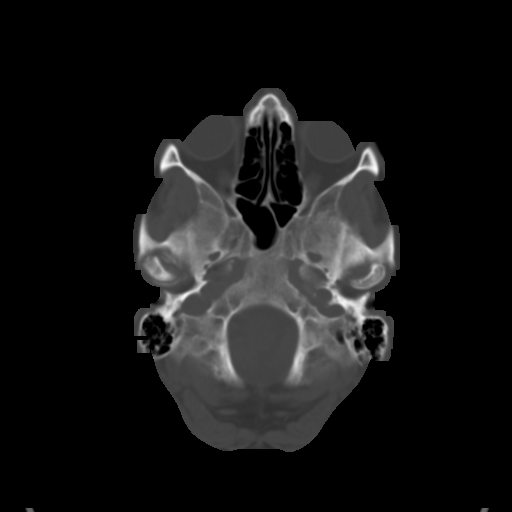
[im 7/30  brain]
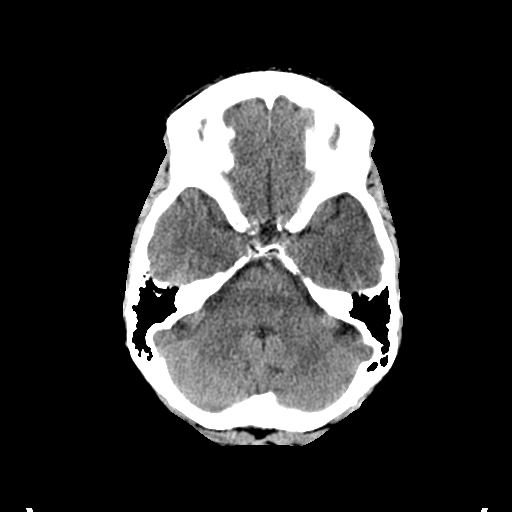
[im 10/30  brain]
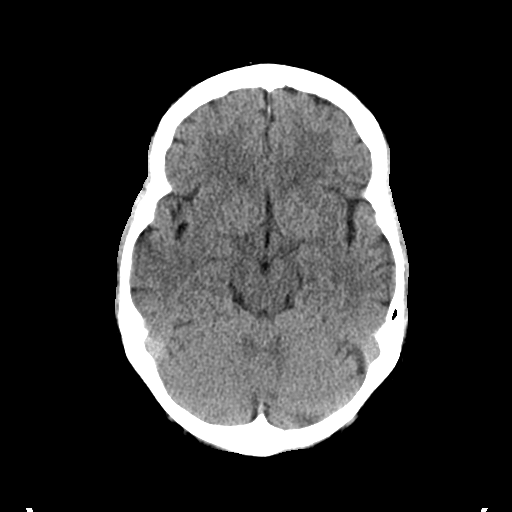
[im 14/30  brain]
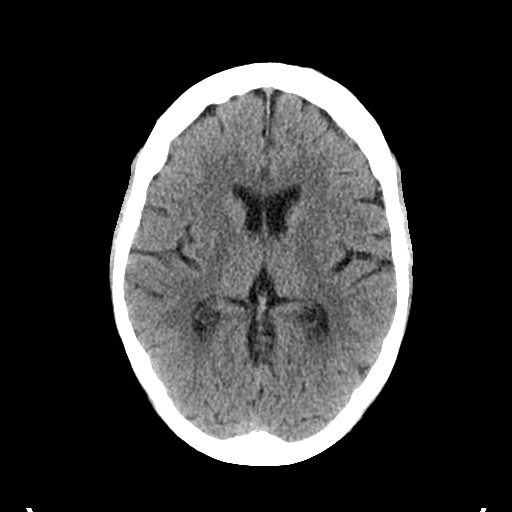
[im 17/30  brain]
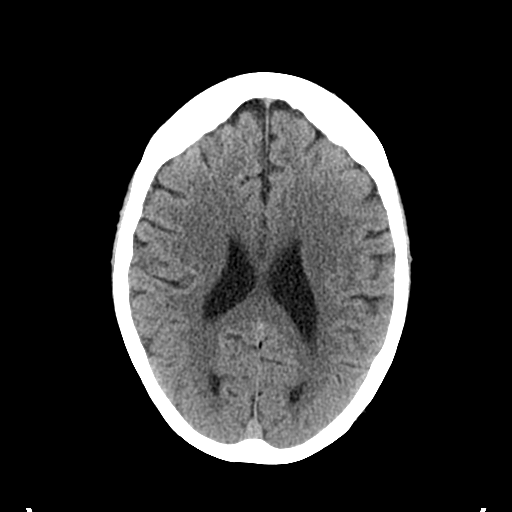
[im 17/30  bone]
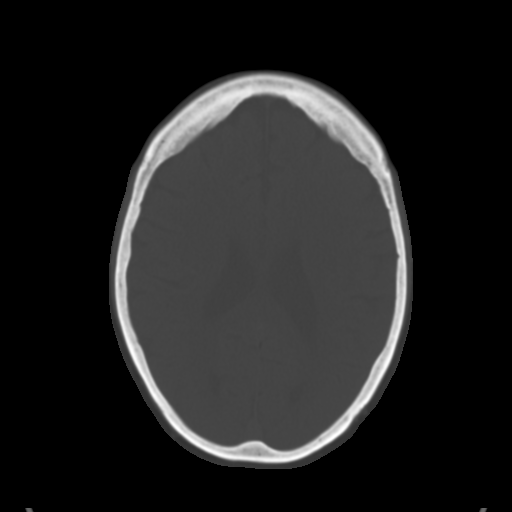
[im 21/30  brain]
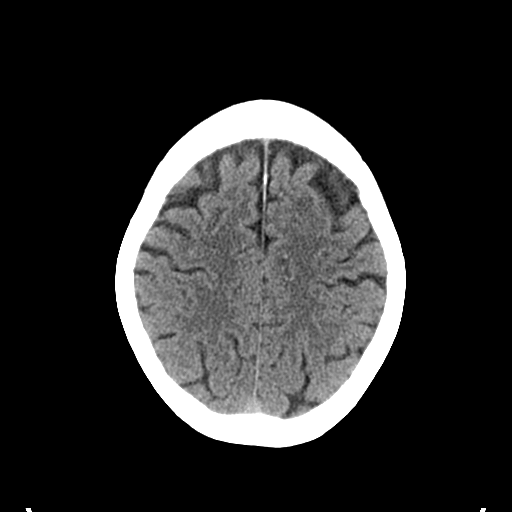
[im 24/30  brain]
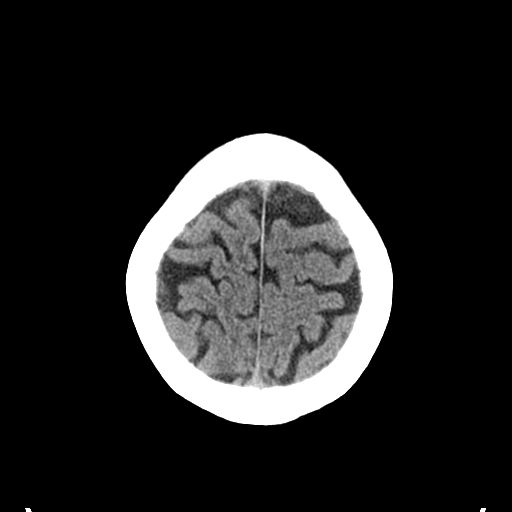
[im 28/30  brain]
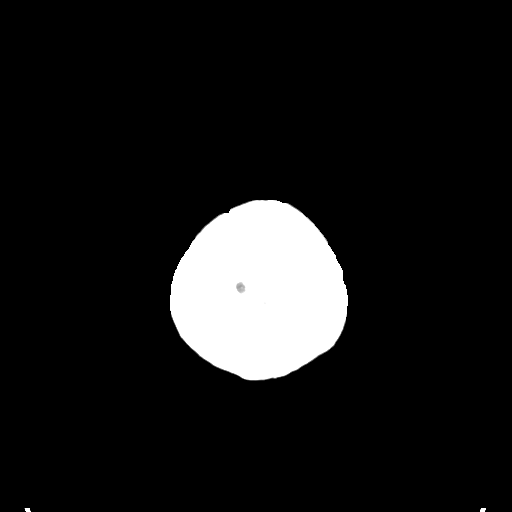

[Series 4: coronal soft tissue · coronal · 0.30mm/px · 3 of 69 slices shown]
[im 23/69  brain]
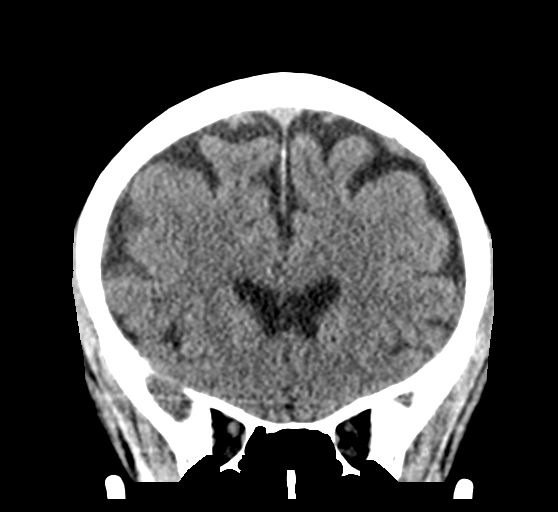
[im 31/69  brain]
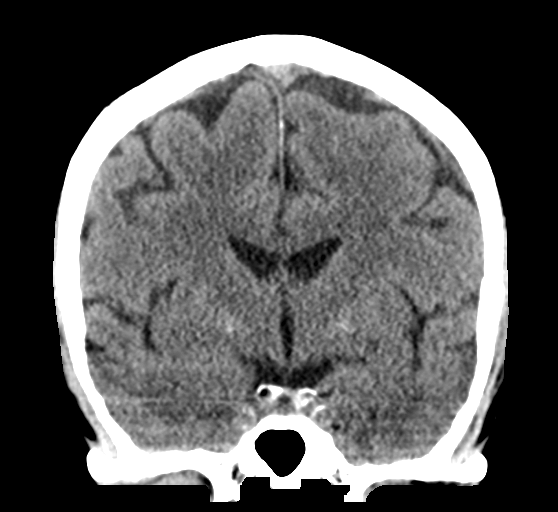
[im 38/69  brain]
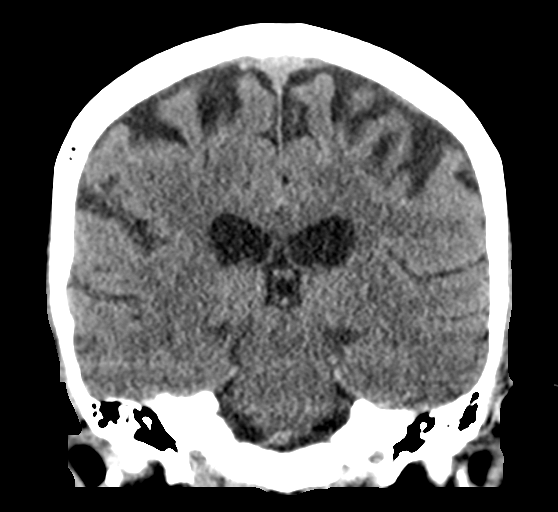

[Series 5: sagittal soft tissue · sagittal · 0.30mm/px · 3 of 57 slices shown]
[im 19/57  brain]
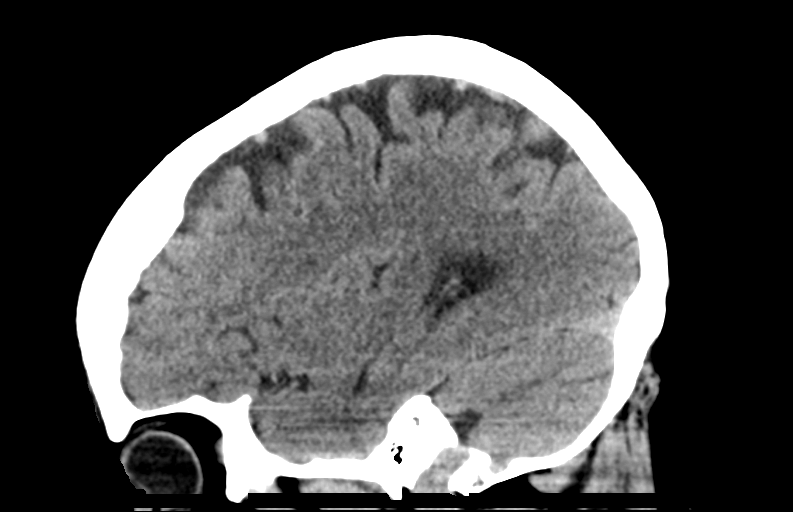
[im 29/57  brain]
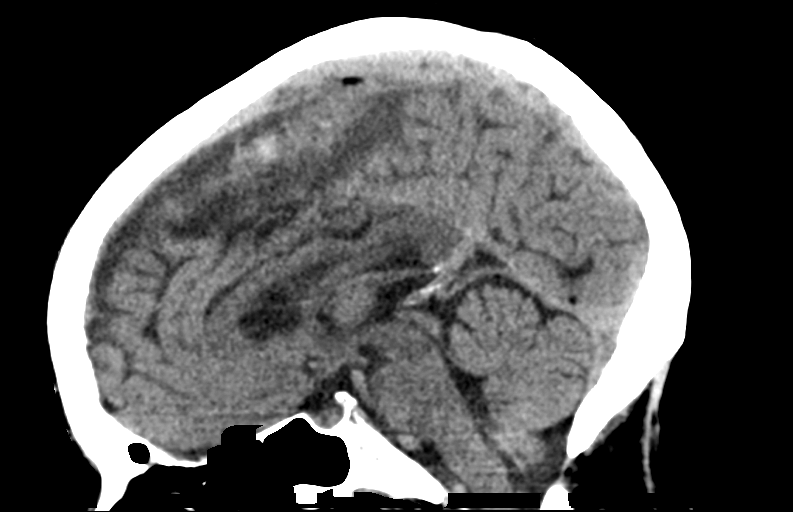
[im 38/57  brain]
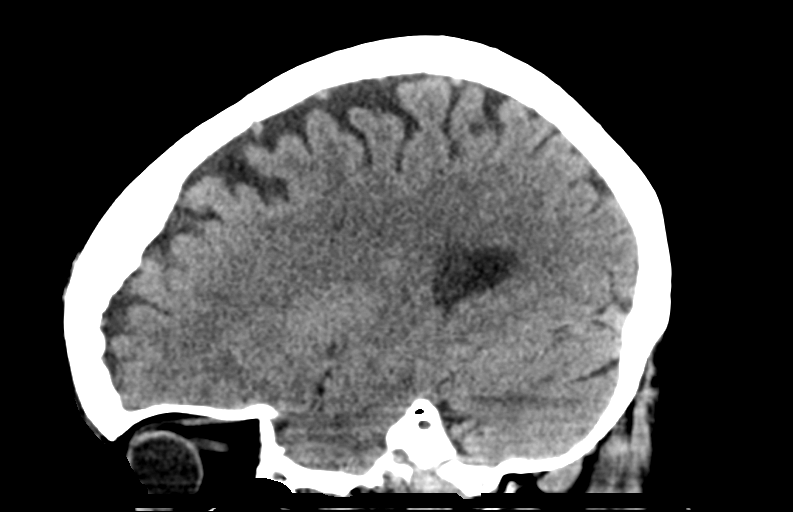

[14 of 47 positions shown; findings below may reference images not displayed]

FINDINGS: CT HEAD FINDINGS

Brain: No evidence of acute infarction, hemorrhage, hydrocephalus,
extra-axial collection or mass lesion/mass effect.

Vascular: No hyperdense vessel or unexpected calcification.

Skull: Intact.  No focal lesion.

Sinuses/Orbits: Status post cataract surgery.  Otherwise negative.

Other: None.

CT CERVICAL SPINE FINDINGS

Alignment: There is mild reversal of the normal cervical lordosis.
0.2 cm anterolisthesis C3 on C4 and trace anterolisthesis C7 on T1
due to facet degenerative disease noted.

Skull base and vertebrae: No acute fracture. No primary bone lesion
or focal pathologic process.

Soft tissues and spinal canal: No prevertebral fluid or swelling. No
visible canal hematoma.

Disc levels: Loss of disc space height is worst at C4-5, C5-6 and
C6-7.

Upper chest: Negative.

Other: None.
IMPRESSION: Negative head CT.

No acute abnormality cervical spine.

Cervical spondylosis.

## 2021-02-05 DIAGNOSIS — F3341 Major depressive disorder, recurrent, in partial remission: Secondary | ICD-10-CM | POA: Diagnosis not present

## 2021-02-12 DIAGNOSIS — M79641 Pain in right hand: Secondary | ICD-10-CM | POA: Diagnosis not present

## 2021-02-12 DIAGNOSIS — M1990 Unspecified osteoarthritis, unspecified site: Secondary | ICD-10-CM | POA: Diagnosis not present

## 2021-02-12 DIAGNOSIS — M79642 Pain in left hand: Secondary | ICD-10-CM | POA: Diagnosis not present

## 2021-03-15 DIAGNOSIS — Z1231 Encounter for screening mammogram for malignant neoplasm of breast: Secondary | ICD-10-CM | POA: Diagnosis not present

## 2021-03-26 DIAGNOSIS — R922 Inconclusive mammogram: Secondary | ICD-10-CM | POA: Diagnosis not present

## 2021-03-26 DIAGNOSIS — R928 Other abnormal and inconclusive findings on diagnostic imaging of breast: Secondary | ICD-10-CM | POA: Diagnosis not present

## 2021-04-03 DIAGNOSIS — M5416 Radiculopathy, lumbar region: Secondary | ICD-10-CM | POA: Diagnosis not present

## 2021-04-06 DIAGNOSIS — I10 Essential (primary) hypertension: Secondary | ICD-10-CM | POA: Diagnosis not present

## 2021-05-01 DIAGNOSIS — R2689 Other abnormalities of gait and mobility: Secondary | ICD-10-CM | POA: Diagnosis not present

## 2021-05-01 DIAGNOSIS — R5381 Other malaise: Secondary | ICD-10-CM | POA: Diagnosis not present

## 2021-05-01 DIAGNOSIS — Z9181 History of falling: Secondary | ICD-10-CM | POA: Diagnosis not present

## 2021-05-01 DIAGNOSIS — M5386 Other specified dorsopathies, lumbar region: Secondary | ICD-10-CM | POA: Diagnosis not present

## 2021-05-05 DIAGNOSIS — M5416 Radiculopathy, lumbar region: Secondary | ICD-10-CM | POA: Diagnosis not present

## 2021-05-08 NOTE — Progress Notes (Signed)
? ?Office Visit Note ? ?Patient: Monica Houston             ?Date of Birth: 10/22/1950           ?MRN: [id number]             ?PCP: Clayborn Heron, MD ?Referring: Clayborn Heron, MD ?Visit Date: 05/22/2021 ?Occupation: @GUAROCC @ ? ?Subjective:  ?Pain in both hands ? ?History of Present Illness: Monica Houston is a 71 y.o. female seen in consultation per request of her PCP.  According to the patient she has had history of arthritis for several years.  She had right shoulder joint surgery for rotator cuff tear in 2020 which she has been successful.  She has been having off-and-on discomfort in her left shoulder joint for the last couple of months.  She was also diagnosed with degenerative disc disease of lumbar spine about 5 months ago.  She had an injection in her lumbar spine by Dr. 2021 about a month ago which was helpful.  She is scheduled to have a repeat injection this week.  She was diagnosed with osteoarthritis in her hands about 5 years ago.  She had surgery on her right first and second digits which was helpful.  Recently she has been experiencing increased pain and discomfort in her hands.  She states is difficult for her to wear her rings.  She had an episode of tingling in her hands which is resolved.  None of the other joints are painful.  There is family history of osteoarthritis in her mother.  She is gravida 2, para 2.  She was also diagnosed with osteopenia but she is currently not taking any treatment. ? ?Activities of Daily Living:  ?Patient reports morning stiffness for 0 minutes.   ?Patient Denies nocturnal pain.  ?Difficulty dressing/grooming: Denies ?Difficulty climbing stairs: Denies ?Difficulty getting out of chair: Denies ?Difficulty using hands for taps, buttons, cutlery, and/or writing: Denies ? ?Review of Systems  ?Constitutional:  Positive for fatigue.  ?HENT:  Negative for mouth sores, mouth dryness and nose dryness.   ?Eyes:  Negative for pain, itching and dryness.  ?Respiratory:   Negative for shortness of breath and difficulty breathing.   ?Cardiovascular:  Negative for chest pain and palpitations.  ?Gastrointestinal:  Negative for blood in stool, constipation and diarrhea.  ?Endocrine: Negative for increased urination.  ?Genitourinary:  Negative for difficulty urinating.  ?Musculoskeletal:  Positive for joint pain and joint pain. Negative for joint swelling, myalgias, morning stiffness, muscle tenderness and myalgias.  ?Skin:  Positive for color change. Negative for rash, redness and sensitivity to sunlight.  ?Allergic/Immunologic: Negative for susceptible to infections.  ?Neurological:  Positive for numbness and memory loss. Negative for dizziness, headaches and weakness.  ?Hematological:  Positive for bruising/bleeding tendency. Negative for swollen glands.  ?Psychiatric/Behavioral:  Positive for depressed mood and confusion. Negative for sleep disturbance. The patient is nervous/anxious.   ? ?PMFS History:  ?Patient Active Problem List  ? Diagnosis Date Noted  ? Primary osteoarthritis of both hands 05/22/2021  ? Incomplete rotator cuff tear or rupture of left shoulder, not specified as traumatic   ? Hypertension   ? GERD (gastroesophageal reflux disease)   ? Depression   ? Anxiety   ?  ?Past Medical History:  ?Diagnosis Date  ? Anxiety   ? Depression   ? GERD (gastroesophageal reflux disease)   ? Hypertension   ? Incomplete rotator cuff tear or rupture of left shoulder, not specified as traumatic   ?  ?  Family History  ?Problem Relation Age of Onset  ? Hypertension Mother   ? Heart Problems Mother   ? Cerebral palsy Sister   ? Healthy Daughter   ? Healthy Daughter   ? ?Past Surgical History:  ?Procedure Laterality Date  ? ABDOMINAL HYSTERECTOMY    ? AUGMENTATION MAMMAPLASTY Bilateral 01/08/1983  ? BREAST SURGERY    ? breast augmentation  ? CESAREAN SECTION    ? x2  ? FINGER SURGERY Right   ? thumb and index finger  ? RESECTION DISTAL CLAVICAL Right 08/05/2018  ? Procedure: DISTAL CLAVICLE  RESECTION;  Surgeon: Salvatore Marvel, MD;  Location: [facility];  Service: Orthopedics;  Laterality: Right;  ? SHOULDER ARTHROSCOPY WITH SUBACROMIAL DECOMPRESSION Right 08/05/2018  ? Procedure: SHOULDER ARTHROSCOPY DEBRIDEMENT  SUBACROMIAL DECOMPRESSION  CLAVICULECTOMY;  Surgeon: Salvatore Marvel, MD;  Location: [facility];  Service: Orthopedics;  Laterality: Right;  POST OP SCALENE  ? TONSILLECTOMY    ? ?Social History  ? ?Social History Narrative  ? Not on file  ? ?Immunization History  ?Administered Date(s) Administered  ? PFIZER(Purple Top)SARS-COV-2 Vaccination 11/12/2019  ?  ? ?Objective: ?Vital Signs: BP (!) 146/87 (BP Location: Right Arm, Patient Position: Sitting, Cuff Size: Normal)   Pulse 69   Ht 5' 1.5" (1.562 m)   Wt 131 lb (59.4 kg)   BMI 24.35 kg/m?   ? ?Physical Exam ?Vitals and nursing note reviewed.  ?Constitutional:   ?   Appearance: She is well-developed.  ?HENT:  ?   Head: Normocephalic and atraumatic.  ?Eyes:  ?   Conjunctiva/sclera: Conjunctivae normal.  ?Cardiovascular:  ?   Rate and Rhythm: Normal rate and regular rhythm.  ?   Heart sounds: Normal heart sounds.  ?Pulmonary:  ?   Effort: Pulmonary effort is normal.  ?   Breath sounds: Normal breath sounds.  ?Abdominal:  ?   General: Bowel sounds are normal.  ?   Palpations: Abdomen is soft.  ?Musculoskeletal:  ?   Cervical [place]: Normal range of motion.  ?Lymphadenopathy:  ?   Cervical: No cervical adenopathy.  ?Skin: ?   General: Skin is warm and dry.  ?   Capillary Refill: Capillary refill takes less than 2 seconds.  ?   Comments: No nailbed capillary changes or Telengectesia's were noted.  No sclerodactyly was noted.  ?Neurological:  ?   Mental Status: She is alert and oriented to person, place, and time.  ?Psychiatric:     ?   Behavior: Behavior normal.  ?  ? ?Musculoskeletal Exam: She had limited painful range of motion of the cervical spine.  She had limited painful range of motion of the lumbar spine.   Shoulder joints, elbow joints, wrist joints with good range of motion.  She had bilateral PIP and DIP thickening with no synovitis.  She had incomplete fist formation.  Hip joints and knee joints in good range of motion.  She had no tenderness over ankles or MTPs. ? ?CDAI Exam: ?CDAI Score: -- ?Patient Global: --; Provider Global: -- ?Swollen: --; Tender: -- ?Joint Exam 05/22/2021  ? ?No joint exam has been documented for this visit  ? ?There is currently no information documented on the homunculus. Go to the Rheumatology activity and complete the homunculus joint exam. ? ?Investigation: ?No additional findings. ? ?Imaging: ?No results found. ? ?Recent Labs: ?Lab Results  ?Component Value Date  ? WBC 12.6 (H) 10/27/2010  ? HGB 13.5 10/27/2010  ? PLT 286 10/27/2010  ? NA  141 08/05/2018  ? K 3.8 08/05/2018  ? CL 106 08/05/2018  ? CO2 24 08/05/2018  ? GLUCOSE 99 08/05/2018  ? BUN 15 08/05/2018  ? CREATININE 0.79 08/05/2018  ? BILITOT 0.8 10/27/2010  ? ALKPHOS 76 10/27/2010  ? AST 19 10/27/2010  ? ALT 16 10/27/2010  ? PROT 7.2 10/27/2010  ? ALBUMIN 4.2 10/27/2010  ? CALCIUM 9.2 08/05/2018  ? GFRAA >60 08/05/2018  ? ? ?Speciality Comments: No specialty comments available. ? ?Procedures:  ?No procedures performed ?Allergies: Patient has no known allergies.  ? ?Assessment / Plan:     ?Visit Diagnoses: Primary osteoarthritis of both hands -patient gives history of osteoarthritis in her hands for many years.  She had surgery on her right hand by Dr. Janee Mornhompson in the past.  She states she has screws in her right first and second digits.  She has taken anti-inflammatories for many years to the point she developed tinnitus in her years.  She is currently taking Tylenol.  ? ?Pain in both hands -she has been experiencing increased pain and discomfort in her bilateral hands.  She gives history of intermittent swelling.  Plan: XR Hand 2 View Right, XR Hand 2 View Left, x-rays of bilateral hands obtained today were consistent with  osteoarthritis.  Postsurgical changes were noted.  Sedimentation rate, Rheumatoid factor, Cyclic citrul peptide antibody, IgG, Uric acid ? ?Incomplete rotator cuff tear or rupture of right shoulder, not spec

## 2021-05-22 ENCOUNTER — Encounter: Payer: Self-pay | Admitting: Rheumatology

## 2021-05-22 ENCOUNTER — Ambulatory Visit (INDEPENDENT_AMBULATORY_CARE_PROVIDER_SITE_OTHER): Payer: Medicare Other

## 2021-05-22 ENCOUNTER — Ambulatory Visit: Payer: Medicare Other | Admitting: Rheumatology

## 2021-05-22 VITALS — BP 146/87 | HR 69 | Ht 61.5 in | Wt 131.0 lb

## 2021-05-22 DIAGNOSIS — M79642 Pain in left hand: Secondary | ICD-10-CM

## 2021-05-22 DIAGNOSIS — M19041 Primary osteoarthritis, right hand: Secondary | ICD-10-CM | POA: Diagnosis not present

## 2021-05-22 DIAGNOSIS — M19042 Primary osteoarthritis, left hand: Secondary | ICD-10-CM

## 2021-05-22 DIAGNOSIS — M79641 Pain in right hand: Secondary | ICD-10-CM

## 2021-05-22 DIAGNOSIS — F32A Depression, unspecified: Secondary | ICD-10-CM

## 2021-05-22 DIAGNOSIS — M5136 Other intervertebral disc degeneration, lumbar region: Secondary | ICD-10-CM

## 2021-05-22 DIAGNOSIS — M51369 Other intervertebral disc degeneration, lumbar region without mention of lumbar back pain or lower extremity pain: Secondary | ICD-10-CM

## 2021-05-22 DIAGNOSIS — M75111 Incomplete rotator cuff tear or rupture of right shoulder, not specified as traumatic: Secondary | ICD-10-CM | POA: Diagnosis not present

## 2021-05-22 DIAGNOSIS — Z8719 Personal history of other diseases of the digestive system: Secondary | ICD-10-CM

## 2021-05-22 DIAGNOSIS — M25512 Pain in left shoulder: Secondary | ICD-10-CM | POA: Diagnosis not present

## 2021-05-22 DIAGNOSIS — I7300 Raynaud's syndrome without gangrene: Secondary | ICD-10-CM

## 2021-05-22 DIAGNOSIS — Z8739 Personal history of other diseases of the musculoskeletal system and connective tissue: Secondary | ICD-10-CM

## 2021-05-22 DIAGNOSIS — E559 Vitamin D deficiency, unspecified: Secondary | ICD-10-CM

## 2021-05-22 DIAGNOSIS — M542 Cervicalgia: Secondary | ICD-10-CM

## 2021-05-22 DIAGNOSIS — Z8589 Personal history of malignant neoplasm of other organs and systems: Secondary | ICD-10-CM

## 2021-05-22 DIAGNOSIS — H9313 Tinnitus, bilateral: Secondary | ICD-10-CM

## 2021-05-22 DIAGNOSIS — R413 Other amnesia: Secondary | ICD-10-CM

## 2021-05-22 DIAGNOSIS — I10 Essential (primary) hypertension: Secondary | ICD-10-CM

## 2021-05-22 DIAGNOSIS — E7800 Pure hypercholesterolemia, unspecified: Secondary | ICD-10-CM

## 2021-05-22 DIAGNOSIS — R5383 Other fatigue: Secondary | ICD-10-CM | POA: Diagnosis not present

## 2021-05-22 DIAGNOSIS — M75112 Incomplete rotator cuff tear or rupture of left shoulder, not specified as traumatic: Secondary | ICD-10-CM

## 2021-05-22 DIAGNOSIS — F419 Anxiety disorder, unspecified: Secondary | ICD-10-CM

## 2021-05-22 NOTE — Patient Instructions (Signed)
Hand Exercises ?Hand exercises can be helpful for almost anyone. These exercises can strengthen the hands, improve flexibility and movement, and increase blood flow to the hands. These results can make work and daily tasks easier. ?Hand exercises can be especially helpful for people who have joint pain from arthritis or have nerve damage from overuse (carpal tunnel syndrome). ?These exercises can also help people who have injured a hand. ?Exercises ?Most of these hand exercises are gentle stretching and motion exercises. It is usually safe to do them often throughout the day. Warming up your hands before exercise may help to reduce stiffness. You can do this with gentle massage or by placing your hands in warm water for 10-15 minutes. ?It is normal to feel some stretching, pulling, tightness, or mild discomfort as you begin new exercises. This will gradually improve. Stop an exercise right away if you feel sudden, severe pain or your pain gets worse. Ask your health care provider which exercises are best for you. ?Knuckle bend or "claw" fist ? ?Stand or sit with your arm, hand, and all five fingers pointed straight up. Make sure to keep your wrist straight during the exercise. ?Gently bend your fingers down toward your palm until the tips of your fingers are touching the top of your palm. Keep your big knuckle straight and just bend the small knuckles in your fingers. ?Hold this position for __________ seconds. ?Straighten (extend) your fingers back to the starting position. ?Repeat this exercise 5-10 times with each hand. ?Full finger fist ? ?Stand or sit with your arm, hand, and all five fingers pointed straight up. Make sure to keep your wrist straight during the exercise. ?Gently bend your fingers into your palm until the tips of your fingers are touching the middle of your palm. ?Hold this position for __________ seconds. ?Extend your fingers back to the starting position, stretching every joint fully. ?Repeat  this exercise 5-10 times with each hand. ?Straight fist ?Stand or sit with your arm, hand, and all five fingers pointed straight up. Make sure to keep your wrist straight during the exercise. ?Gently bend your fingers at the big knuckle, where your fingers meet your hand, and the middle knuckle. Keep the knuckle at the tips of your fingers straight and try to touch the bottom of your palm. ?Hold this position for __________ seconds. ?Extend your fingers back to the starting position, stretching every joint fully. ?Repeat this exercise 5-10 times with each hand. ?Tabletop ? ?Stand or sit with your arm, hand, and all five fingers pointed straight up. Make sure to keep your wrist straight during the exercise. ?Gently bend your fingers at the big knuckle, where your fingers meet your hand, as far down as you can while keeping the small knuckles in your fingers straight. Think of forming a tabletop with your fingers. ?Hold this position for __________ seconds. ?Extend your fingers back to the starting position, stretching every joint fully. ?Repeat this exercise 5-10 times with each hand. ?Finger spread ? ?Place your hand flat on a table with your palm facing down. Make sure your wrist stays straight as you do this exercise. ?Spread your fingers and thumb apart from each other as far as you can until you feel a gentle stretch. Hold this position for __________ seconds. ?Bring your fingers and thumb tight together again. Hold this position for __________ seconds. ?Repeat this exercise 5-10 times with each hand. ?Making circles ? ?Stand or sit with your arm, hand, and all five fingers pointed   straight up. Make sure to keep your wrist straight during the exercise. ?Make a circle by touching the tip of your thumb to the tip of your index finger. ?Hold for __________ seconds. Then open your hand wide. ?Repeat this motion with your thumb and each finger on your hand. ?Repeat this exercise 5-10 times with each hand. ?Thumb  motion ? ?Sit with your forearm resting on a table and your wrist straight. Your thumb should be facing up toward the ceiling. Keep your fingers relaxed as you move your thumb. ?Lift your thumb up as high as you can toward the ceiling. Hold for __________ seconds. ?Bend your thumb across your palm as far as you can, reaching the tip of your thumb for the small finger (pinkie) side of your palm. Hold for __________ seconds. ?Repeat this exercise 5-10 times with each hand. ?Grip strengthening ? ?Hold a stress ball or other soft ball in the middle of your hand. ?Slowly increase the pressure, squeezing the ball as much as you can without causing pain. Think of bringing the tips of your fingers into the middle of your palm. All of your finger joints should bend when doing this exercise. ?Hold your squeeze for __________ seconds, then relax. ?Repeat this exercise 5-10 times with each hand. ?Contact a health care provider if: ?Your hand pain or discomfort gets much worse when you do an exercise. ?Your hand pain or discomfort does not improve within 2 hours after you exercise. ?If you have any of these problems, stop doing these exercises right away. Do not do them again unless your health care provider says that you can. ?Get help right away if: ?You develop sudden, severe hand pain or swelling. If this happens, stop doing these exercises right away. Do not do them again unless your health care provider says that you can. ?This information is not intended to replace advice given to you by your health care provider. Make sure you discuss any questions you have with your health care provider. ?Document Revised: 04/13/2020 Document Reviewed: 04/13/2020 ?[employer] Patient Education ? 2023 [employer] ?Back Exercises ?The following exercises strengthen the muscles that help to support the trunk (torso) and back. They also help to keep the lower back flexible. Doing these exercises can help to prevent or lessen existing low  back pain. ?If you have back pain or discomfort, try doing these exercises 2-3 times each day or as told by your health care provider. ?As your pain improves, do them once each day, but increase the number of times that you repeat the steps for each exercise (do more repetitions). ?To prevent the recurrence of back pain, continue to do these exercises once each day or as told by your health care provider. ?Do exercises exactly as told by your health care provider and adjust them as directed. It is normal to feel mild stretching, pulling, tightness, or discomfort as you do these exercises, but you should stop right away if you feel sudden pain or your pain gets worse. ?Exercises ?Single knee to chest ?Repeat these steps 3-5 times for each leg: ?Lie on your back on a firm bed or the floor with your legs extended. ?Bring one knee to your chest. Your other leg should stay extended and in contact with the floor. ?Hold your knee in place by grabbing your knee or thigh with both hands and hold. ?Pull on your knee until you feel a gentle stretch in your lower back or buttocks. ?Hold the stretch for 10-30 seconds. ?  Slowly release and straighten your leg. ? ?Pelvic tilt ?Repeat these steps 5-10 times: ?Lie on your back on a firm bed or the floor with your legs extended. ?Bend your knees so they are pointing toward the ceiling and your feet are flat on the floor. ?Tighten your lower abdominal muscles to press your lower back against the floor. This motion will tilt your pelvis so your tailbone points up toward the ceiling instead of pointing to your feet or the floor. ?With gentle tension and even breathing, hold this position for 5-10 seconds. ? ?Cat-cow ?Repeat these steps until your lower back becomes more flexible: ?Get into a hands-and-knees position on a firm bed or the floor. Keep your hands under your shoulders, and keep your knees under your hips. You may place padding under your knees for comfort. ?Let your head hang  down toward your chest. Contract your abdominal muscles and point your tailbone toward the floor so your lower back becomes rounded like the back of a cat. ?Hold this position for 5 seconds. ?Slowly lift yo

## 2021-05-22 NOTE — Progress Notes (Signed)
Office Visit Note  Patient: Monica Houston             Date of Birth: April 12, 1950           MRN: [id number]             PCP: Aretta Nip, MD Referring: Aretta Nip, MD Visit Date: 06/05/2021 Occupation: '@GUAROCC' @  Subjective:  Pain in multiple joints  History of Present Illness: Monica Houston is a 71 y.o. female with history of pain in multiple joints.  She states she continues to have discomfort in her neck and bilateral hands.  She also has lower back pain and discomfort in her knee joints.  She has not noticed any joint swelling.  She has Raynaud's phenomenon during the winter months.  She denies any history of digital ulcers.  Activities of Daily Living:  Patient reports morning stiffness for 1 hour.   Patient Denies nocturnal pain.  Difficulty dressing/grooming: Denies Difficulty climbing stairs: Denies Difficulty getting out of chair: Denies Difficulty using hands for taps, buttons, cutlery, and/or writing: Denies  Review of Systems  Constitutional:  Negative for fatigue.  HENT:  Negative for mouth sores, mouth dryness and nose dryness.   Eyes:  Negative for pain, itching and dryness.  Respiratory:  Negative for shortness of breath and difficulty breathing.   Cardiovascular:  Negative for chest pain and palpitations.  Gastrointestinal:  Negative for blood in stool, constipation and diarrhea.  Endocrine: Negative for increased urination.  Genitourinary:  Negative for difficulty urinating.  Musculoskeletal:  Positive for morning stiffness. Negative for joint pain, joint pain, joint swelling, myalgias, muscle tenderness and myalgias.  Skin:  Negative for color change, rash and redness.  Allergic/Immunologic: Negative for susceptible to infections.  Neurological:  Positive for memory loss. Negative for dizziness, numbness, headaches and weakness.  Hematological:  Negative for bruising/bleeding tendency.  Psychiatric/Behavioral:  Positive for confusion.    PMFS  History:  Patient Active Problem List   Diagnosis Date Noted   Primary osteoarthritis of both hands 05/22/2021   Incomplete rotator cuff tear or rupture of left shoulder, not specified as traumatic    Hypertension    GERD (gastroesophageal reflux disease)    Depression    Anxiety     Past Medical History:  Diagnosis Date   Anxiety    Depression    GERD (gastroesophageal reflux disease)    Hypertension    Incomplete rotator cuff tear or rupture of left shoulder, not specified as traumatic     Family History  Problem Relation Age of Onset   Hypertension Mother    Heart Problems Mother    Cerebral palsy Sister    Healthy Daughter    Healthy Daughter    Past Surgical History:  Procedure Laterality Date   ABDOMINAL HYSTERECTOMY     AUGMENTATION MAMMAPLASTY Bilateral 01/08/1983   BREAST SURGERY     breast augmentation   CESAREAN SECTION     x2   FINGER SURGERY Right    thumb and index finger   RESECTION DISTAL CLAVICAL Right 08/05/2018   Procedure: DISTAL CLAVICLE RESECTION;  Surgeon: Elsie Saas, MD;  Location: [facility];  Service: Orthopedics;  Laterality: Right;   SHOULDER ARTHROSCOPY WITH SUBACROMIAL DECOMPRESSION Right 08/05/2018   Procedure: SHOULDER ARTHROSCOPY DEBRIDEMENT  SUBACROMIAL DECOMPRESSION  CLAVICULECTOMY;  Surgeon: Elsie Saas, MD;  Location: [facility];  Service: Orthopedics;  Laterality: Right;  POST OP SCALENE   TONSILLECTOMY     Social History  Social History Narrative   Not on file   Immunization History  Administered Date(s) Administered   PFIZER(Purple Top)SARS-COV-2 Vaccination 11/12/2019     Objective: Vital Signs: BP (!) 155/85 (BP Location: Left Arm, Patient Position: Sitting, Cuff Size: Normal)   Pulse 65   Ht 5' 1.75" (1.568 m)   Wt 132 lb 9.6 oz (60.1 kg)   BMI 24.45 kg/m    Physical Exam Vitals and nursing note reviewed.  Constitutional:      Appearance: She is well-developed.  HENT:      Head: Normocephalic and atraumatic.  Eyes:     Conjunctiva/sclera: Conjunctivae normal.  Cardiovascular:     Rate and Rhythm: Normal rate and regular rhythm.     Heart sounds: Normal heart sounds.  Pulmonary:     Effort: Pulmonary effort is normal.     Breath sounds: Normal breath sounds.  Abdominal:     General: Bowel sounds are normal.     Palpations: Abdomen is soft.  Musculoskeletal:     Cervical back: Normal range of motion.  Lymphadenopathy:     Cervical: No cervical adenopathy.  Skin:    General: Skin is warm and dry.     Capillary Refill: Capillary refill takes less than 2 seconds.     Comments: No sclerodactyly, Telangiectasia or digital ulcers were noted.  No nailbed capillary changes were noted.  Neurological:     Mental Status: She is alert and oriented to person, place, and time.  Psychiatric:        Behavior: Behavior normal.     Musculoskeletal Exam: She had limited range of motion of the cervical spine with discomfort on left lateral rotation.  She had limited painful range of motion of her lumbar spine.  Shoulder joints, elbow joints, wrist joints with good range of motion with no synovitis.  She had bilateral CMC arthritis right CMC subluxation.  PIP and DIP thickening with subluxation of the right third digit DIP joint was noted.  Hip joints and knee joints with good range of motion.  There was no tenderness over ankles or MTPs.  CDAI Exam: CDAI Score: -- Patient Global: --; Provider Global: -- Swollen: --; Tender: -- Joint Exam 06/05/2021   No joint exam has been documented for this visit   There is currently no information documented on the homunculus. Go to the Rheumatology activity and complete the homunculus joint exam.  Investigation: No additional findings.  Imaging: XR Cervical Spine 2 or 3 views  Result Date: 05/22/2021 Multilevel spondylosis with narrowing between C3-C4, C4-C5, C5-C6, C6-C7 was noted.  Most severe narrowing was noted between C5  and C6.  Anterior osteophytes were noted.  Facet joint arthropathy was noted. Impression: These findings are consistent with severe multilevel spondylosis and facet joint arthropathy.  XR Hand 2 View Left  Result Date: 05/22/2021 CMC narrowing and subluxation was noted.  PIP and DIP narrowing was noted.  Subluxation of second DIP joint was noted.  No intercarpal or radiocarpal joint space narrowing was noted.  No erosive changes were noted. Impression: These findings are consistent with osteoarthritis of the hand.  XR Hand 2 View Right  Result Date: 05/22/2021 Severe CMC narrowing and subluxation was noted.  PIP and DIP narrowing was noted.  Subluxation of second DIP joint was noted.  Fusion of the first PIP joint and first DIP joint with a screw placement was noted.  No intercarpal radiocarpal joint space narrowing were noted.  No erosive changes were noted. Impression: These findings  are consistent with osteoarthritis with postsurgical changes.   Recent Labs: Lab Results  Component Value Date   WBC 9.7 05/22/2021   HGB 13.1 05/22/2021   PLT 288 05/22/2021   NA 140 05/22/2021   K 4.6 05/22/2021   CL 103 05/22/2021   CO2 27 05/22/2021   GLUCOSE 92 05/22/2021   BUN 18 05/22/2021   CREATININE 0.87 05/22/2021   BILITOT 0.5 05/22/2021   ALKPHOS 76 10/27/2010   AST 14 05/22/2021   ALT 13 05/22/2021   PROT 6.9 05/22/2021   ALBUMIN 4.2 10/27/2010   CALCIUM 9.7 05/22/2021   GFRAA >60 08/05/2018   May 22, 2021 ANA 1: 40 cytoplasmic, 1: 80NH, ENA (double-stranded DNA, SSA, SSB, SCL 70, RNP, Smith) negative, C3-C4 normal, anticardiolipin negative, beta-2 GP 1 negative,  RF negative, anti-CCP negative, ESR 9, uric acid 5.6  Speciality Comments: No specialty comments available.  Procedures:  No procedures performed Allergies: Patient has no known allergies.   Assessment / Plan:     Visit Diagnoses: Primary osteoarthritis of both hands - S/p right first PIP and right first DIP fusion by  Dr. Grandville Silos.  X-rays were consistent with osteoarthritis.  X-ray findings were reviewed with the patient.  She had bilateral CMC subluxation more prominent on the right side.  She had bilateral PIP and DIP thickening consistent with osteoarthritis.  Joint protection muscle strengthening was discussed.  Incomplete rotator cuff tear or rupture of right shoulder, not specified as traumatic - She denies discomfort now.  Acute pain of left shoulder-improved.  DDD (degenerative disc disease), cervical - History of chronic neck pain.  X-rays showed severe multilevel spondylosis with most severe narrowing at C5-6 and facet joint arthropathy.  X-ray findings were reviewed with the patient.  Patient has an appointment coming with Dr. Herma Mering and will discuss further evaluation.  DDD (degenerative disc disease), lumbar - Followed by Dr. Nelva Bush.  Positive ANA (antinuclear antibody) - ENA, complements, anticardiolipin, beta-2 GP 1, lupus anticoagulant were all negative.  Lab findings were discussed with the patient.  She has low titer positive ANA.  She also has Raynaud's phenomenon.  Raynaud's syndrome without gangrene - History of Raynauds for several years.  ANA positive, ENA negative.  There were no features of sclerodactyly, nailbed capillary changes.  I advised her to contact me in case she develops any new symptoms.  Other medical problems are listed as follows:  History of osteopenia  Vitamin D deficiency - Patient to bring DEXA scan.  Primary hypertension  Hypercholesteremia  History of gastroesophageal reflux (GERD)  Anxiety and depression - she lost her mother and her husband last year.  Memory loss - Patient was referred to neurology.  Tinnitus of both ears  History of squamous cell carcinoma  Orders: No orders of the defined types were placed in this encounter.  No orders of the defined types were placed in this encounter.    Follow-Up Instructions: Return in about 1 year  (around 06/06/2022) for Osteoarthritis.   Bo Merino, MD  Note - This record has been created using Editor, commissioning.  Chart creation errors have been sought, but may not always  have been located. Such creation errors do not reflect on  the standard of medical care.

## 2021-05-24 DIAGNOSIS — M5416 Radiculopathy, lumbar region: Secondary | ICD-10-CM | POA: Diagnosis not present

## 2021-05-25 ENCOUNTER — Encounter: Payer: Self-pay | Admitting: Physician Assistant

## 2021-05-25 LAB — COMPLETE METABOLIC PANEL WITH GFR
AG Ratio: 1.8 (calc) (ref 1.0–2.5)
ALT: 13 U/L (ref 6–29)
AST: 14 U/L (ref 10–35)
Albumin: 4.4 g/dL (ref 3.6–5.1)
Alkaline phosphatase (APISO): 71 U/L (ref 37–153)
BUN: 18 mg/dL (ref 7–25)
CO2: 27 mmol/L (ref 20–32)
Calcium: 9.7 mg/dL (ref 8.6–10.4)
Chloride: 103 mmol/L (ref 98–110)
Creat: 0.87 mg/dL (ref 0.60–1.00)
Globulin: 2.5 g/dL (calc) (ref 1.9–3.7)
Glucose, Bld: 92 mg/dL (ref 65–99)
Potassium: 4.6 mmol/L (ref 3.5–5.3)
Sodium: 140 mmol/L (ref 135–146)
Total Bilirubin: 0.5 mg/dL (ref 0.2–1.2)
Total Protein: 6.9 g/dL (ref 6.1–8.1)
eGFR: 71 mL/min/{1.73_m2} (ref >=60)

## 2021-05-25 LAB — ANTI-NUCLEAR AB-TITER (ANA TITER)
ANA TITER: 1:80 {titer} — ABNORMAL HIGH
ANA Titer 1: 1:40 {titer} — ABNORMAL HIGH

## 2021-05-25 LAB — CBC WITH DIFFERENTIAL/PLATELET
Absolute Monocytes: 757 cells/uL (ref 200–950)
Basophils Absolute: 49 cells/uL (ref 0–200)
Basophils Relative: 0.5 %
Eosinophils Absolute: 427 cells/uL (ref 15–500)
Eosinophils Relative: 4.4 %
HCT: 39.2 % (ref 35.0–45.0)
Hemoglobin: 13.1 g/dL (ref 11.7–15.5)
Lymphs Abs: 3414 cells/uL (ref 850–3900)
MCH: 30.8 pg (ref 27.0–33.0)
MCHC: 33.4 g/dL (ref 32.0–36.0)
MCV: 92.2 fL (ref 80.0–100.0)
MPV: 10.8 fL (ref 7.5–12.5)
Monocytes Relative: 7.8 %
Neutro Abs: 5054 cells/uL (ref 1500–7800)
Neutrophils Relative %: 52.1 %
Platelets: 288 10*3/uL (ref 140–400)
RBC: 4.25 10*6/uL (ref 3.80–5.10)
RDW: 13.6 % (ref 11.0–15.0)
Total Lymphocyte: 35.2 %
WBC: 9.7 10*3/uL (ref 3.8–10.8)

## 2021-05-25 LAB — RHEUMATOID FACTOR: Rheumatoid fact SerPl-aCnc: <14 IU/mL (ref <14)

## 2021-05-25 LAB — SEDIMENTATION RATE: Sed Rate: 9 mm/h (ref 0–30)

## 2021-05-25 LAB — ANA: Anti Nuclear Antibody (ANA): POSITIVE — AB

## 2021-05-25 LAB — CYCLIC CITRUL PEPTIDE ANTIBODY, IGG: Cyclic Citrullin Peptide Ab: <16 UNITS

## 2021-05-25 LAB — URIC ACID: Uric Acid, Serum: 5.6 mg/dL (ref 2.5–7.0)

## 2021-05-27 ENCOUNTER — Telehealth: Payer: Self-pay | Admitting: Rheumatology

## 2021-05-27 DIAGNOSIS — I7300 Raynaud's syndrome without gangrene: Secondary | ICD-10-CM

## 2021-05-27 DIAGNOSIS — M79641 Pain in right hand: Secondary | ICD-10-CM

## 2021-05-27 DIAGNOSIS — M542 Cervicalgia: Secondary | ICD-10-CM

## 2021-05-27 NOTE — Progress Notes (Signed)
I will discuss results at the follow-up visit.

## 2021-05-27 NOTE — Telephone Encounter (Signed)
Positive ANA 1: 80NH noted.  She also has history of Raynauds.  Please have patient come in for ENA, C3-C4, anticardiolipin, beta-2 GP 1 and lupus anticoagulant. Bo Merino, MD

## 2021-05-28 NOTE — Telephone Encounter (Signed)
Patient advised Dr. Corliss Skains would like for her to come into the office for additional lab work. Patient states she will come 05/29/2021 to have those done. Patient advised of lab hours and future order placed.  Patient is also requesting a disk with her back x-ray on it. She is hoping to pick it up when she comes for labs tomorrow.

## 2021-05-28 NOTE — Telephone Encounter (Signed)
I called patient, CD at front desk. 

## 2021-05-29 ENCOUNTER — Other Ambulatory Visit: Payer: Self-pay | Admitting: *Deleted

## 2021-05-29 DIAGNOSIS — M79641 Pain in right hand: Secondary | ICD-10-CM | POA: Diagnosis not present

## 2021-05-29 DIAGNOSIS — M542 Cervicalgia: Secondary | ICD-10-CM | POA: Diagnosis not present

## 2021-05-29 DIAGNOSIS — I7300 Raynaud's syndrome without gangrene: Secondary | ICD-10-CM

## 2021-05-29 DIAGNOSIS — M79642 Pain in left hand: Secondary | ICD-10-CM

## 2021-06-05 ENCOUNTER — Ambulatory Visit: Payer: Medicare Other | Admitting: Plastic Surgery

## 2021-06-05 ENCOUNTER — Encounter: Payer: Self-pay | Admitting: Plastic Surgery

## 2021-06-05 ENCOUNTER — Ambulatory Visit: Payer: Medicare Other | Admitting: Rheumatology

## 2021-06-05 ENCOUNTER — Encounter: Payer: Self-pay | Admitting: Rheumatology

## 2021-06-05 ENCOUNTER — Telehealth: Payer: Self-pay | Admitting: Plastic Surgery

## 2021-06-05 VITALS — BP 155/85 | HR 65 | Ht 61.75 in | Wt 132.6 lb

## 2021-06-05 DIAGNOSIS — R413 Other amnesia: Secondary | ICD-10-CM

## 2021-06-05 DIAGNOSIS — Z8589 Personal history of malignant neoplasm of other organs and systems: Secondary | ICD-10-CM

## 2021-06-05 DIAGNOSIS — F419 Anxiety disorder, unspecified: Secondary | ICD-10-CM

## 2021-06-05 DIAGNOSIS — E7800 Pure hypercholesterolemia, unspecified: Secondary | ICD-10-CM

## 2021-06-05 DIAGNOSIS — T8543XA Leakage of breast prosthesis and implant, initial encounter: Secondary | ICD-10-CM | POA: Diagnosis not present

## 2021-06-05 DIAGNOSIS — M5030 Other cervical disc degeneration, unspecified cervical region: Secondary | ICD-10-CM | POA: Diagnosis not present

## 2021-06-05 DIAGNOSIS — M5136 Other intervertebral disc degeneration, lumbar region: Secondary | ICD-10-CM

## 2021-06-05 DIAGNOSIS — R768 Other specified abnormal immunological findings in serum: Secondary | ICD-10-CM

## 2021-06-05 DIAGNOSIS — E559 Vitamin D deficiency, unspecified: Secondary | ICD-10-CM

## 2021-06-05 DIAGNOSIS — Z8719 Personal history of other diseases of the digestive system: Secondary | ICD-10-CM

## 2021-06-05 DIAGNOSIS — R7689 Other specified abnormal immunological findings in serum: Secondary | ICD-10-CM

## 2021-06-05 DIAGNOSIS — M19041 Primary osteoarthritis, right hand: Secondary | ICD-10-CM | POA: Diagnosis not present

## 2021-06-05 DIAGNOSIS — M75111 Incomplete rotator cuff tear or rupture of right shoulder, not specified as traumatic: Secondary | ICD-10-CM | POA: Diagnosis not present

## 2021-06-05 DIAGNOSIS — Z8739 Personal history of other diseases of the musculoskeletal system and connective tissue: Secondary | ICD-10-CM

## 2021-06-05 DIAGNOSIS — I7300 Raynaud's syndrome without gangrene: Secondary | ICD-10-CM

## 2021-06-05 DIAGNOSIS — F32A Depression, unspecified: Secondary | ICD-10-CM

## 2021-06-05 DIAGNOSIS — M25512 Pain in left shoulder: Secondary | ICD-10-CM

## 2021-06-05 DIAGNOSIS — M19042 Primary osteoarthritis, left hand: Secondary | ICD-10-CM

## 2021-06-05 DIAGNOSIS — H9313 Tinnitus, bilateral: Secondary | ICD-10-CM

## 2021-06-05 DIAGNOSIS — I10 Essential (primary) hypertension: Secondary | ICD-10-CM

## 2021-06-05 DIAGNOSIS — M51369 Other intervertebral disc degeneration, lumbar region without mention of lumbar back pain or lower extremity pain: Secondary | ICD-10-CM

## 2021-06-05 NOTE — Telephone Encounter (Signed)
Made correction and faxed to [facility].

## 2021-06-05 NOTE — Patient Instructions (Signed)
Cervical Strain and Sprain Rehab Ask your health care provider which exercises are safe for you. Do exercises exactly as told by your health care provider and adjust them as directed. It is normal to feel mild stretching, pulling, tightness, or discomfort as you do these exercises. Stop right away if you feel sudden pain or your pain gets worse. Do not begin these exercises until told by your health care provider. Stretching and range-of-motion exercises Cervical side bending  Using good posture, sit on a stable chair or stand up. Without moving your shoulders, slowly tilt your left / right ear to your shoulder until you feel a stretch in the opposite side neck muscles. You should be looking straight ahead. Hold for __________ seconds. Repeat with the other side of your neck. Repeat __________ times. Complete this exercise __________ times a day. Cervical rotation  Using good posture, sit on a stable chair or stand up. Slowly turn your head to the side as if you are looking over your left / right shoulder. Keep your eyes level with the ground. Stop when you feel a stretch along the side and the back of your neck. Hold for __________ seconds. Repeat this by turning to your other side. Repeat __________ times. Complete this exercise __________ times a day. Thoracic extension and pectoral stretch  Roll a towel or a small blanket so it is about 4 inches (10 cm) in diameter. Lie down on your back on a firm surface. Put the towel in the middle of your back across your spine. It should not be under your shoulder blades. Put your hands behind your head and let your elbows fall out to your sides. Hold for __________ seconds. Repeat __________ times. Complete this exercise __________ times a day. Strengthening exercises Isometric upper cervical flexion  Lie on your back with a thin pillow behind your head and a small rolled-up towel under your neck. Gently tuck your chin toward your chest and  nod your head down to look toward your feet. Do not lift your head off the pillow. Hold for __________ seconds. Release the tension slowly. Relax your neck muscles completely before you repeat this exercise. Repeat __________ times. Complete this exercise __________ times a day. Isometric cervical extension  Stand about 6 inches (15 cm) away from a wall, with your back facing the wall. Place a soft object, about 6-8 inches (15-20 cm) in diameter, between the back of your head and the wall. A soft object could be a small pillow, a ball, or a folded towel. Gently tilt your head back and press into the soft object. Keep your jaw and forehead relaxed. Hold for __________ seconds. Release the tension slowly. Relax your neck muscles completely before you repeat this exercise. Repeat __________ times. Complete this exercise __________ times a day. Posture and body mechanics Body mechanics refers to the movements and positions of your body while you do your daily activities. Posture is part of body mechanics. Good posture and healthy body mechanics can help to relieve stress in your body's tissues and joints. Good posture means that your spine is in its natural S-curve position (your spine is neutral), your shoulders are pulled back slightly, and your head is not tipped forward. The following are general guidelines for applying improved posture and body mechanics to your everyday activities. Sitting  When sitting, keep your spine neutral and keep your feet flat on the floor. Use a footrest, if necessary, and keep your thighs parallel to the floor. Avoid rounding   your shoulders, and avoid tilting your head forward. When working at a desk or a computer, keep your desk at a height where your hands are slightly lower than your elbows. Slide your chair under your desk so you are close enough to maintain good posture. When working at a computer, place your monitor at a height where you are looking straight ahead  and you do not have to tilt your head forward or downward to look at the screen. Standing  When standing, keep your spine neutral and keep your feet about hip-width apart. Keep a slight bend in your knees. Your ears, shoulders, and hips should line up. When you do a task in which you stand in one place for a long time, place one foot up on a stable object that is 2-4 inches (5-10 cm) high, such as a footstool. This helps keep your spine neutral. Resting When lying down and resting, avoid positions that are most painful for you. Try to support your neck in a neutral position. You can use a contour pillow or a small rolled-up towel. Your pillow should support your neck but not push on it. This information is not intended to replace advice given to you by your health care provider. Make sure you discuss any questions you have with your health care provider. Document Revised: 11/13/2020 Document Reviewed: 11/13/2020 Elsevier Patient Education  2023 [employer]vier Inc.  

## 2021-06-05 NOTE — Progress Notes (Signed)
Patient ID: Monica Houston, female    DOB: 1950-10-27, 71 y.o.   MRN: [id number]   Chief Complaint  Patient presents with   Consult   Breast Problem    The patient is a 71 year old female here for evaluation of her breast.  She is 5 feet tall and weighs 132 pounds.  She had silicone breast implants placed 35 years ago.  She is not sure if they are under the muscle or the size.  They were placed through an inframammary incision.  They are ruptured according to the mammogram and by physical exam on both sides.  The mammogram shows submammary placement with extracapsular rupture.  She does not have a history of diabetes or history of breast cancer.  The implants are not painful and she is not a smoker.  They are firm and it is concerning that it could get worse.  She has had several surgeries in the past including a C-section, hysterectomy, finger surgery and a shoulder surgery.  She lost her husband last year as well as her mother.  She has been having a lot to deal with with arranging the estate.   Review of Systems  Constitutional: Negative.   HENT: Negative.    Eyes: Negative.   Respiratory: Negative.  Negative for chest tightness and shortness of breath.   Cardiovascular: Negative.   Gastrointestinal: Negative.   Endocrine: Negative.   Genitourinary: Negative.   Musculoskeletal: Negative.   Skin: Negative.   Hematological: Negative.   Psychiatric/Behavioral: Negative.     Past Medical History:  Diagnosis Date   Anxiety    Depression    GERD (gastroesophageal reflux disease)    Hypertension    Incomplete rotator cuff tear or rupture of left shoulder, not specified as traumatic     Past Surgical History:  Procedure Laterality Date   ABDOMINAL HYSTERECTOMY     AUGMENTATION MAMMAPLASTY Bilateral 01/08/1983   BREAST SURGERY     breast augmentation   CESAREAN SECTION     x2   FINGER SURGERY Right    thumb and index finger   RESECTION DISTAL CLAVICAL Right 08/05/2018    Procedure: DISTAL CLAVICLE RESECTION;  Surgeon: Salvatore Marvel, MD;  Location: [facility];  Service: Orthopedics;  Laterality: Right;   SHOULDER ARTHROSCOPY WITH SUBACROMIAL DECOMPRESSION Right 08/05/2018   Procedure: SHOULDER ARTHROSCOPY DEBRIDEMENT  SUBACROMIAL DECOMPRESSION  CLAVICULECTOMY;  Surgeon: Salvatore Marvel, MD;  [facility];  Service: Orthopedics;  Laterality: Right;  POST OP SCALENE   TONSILLECTOMY        Current Outpatient Medications:    ALPRAZolam (XANAX) 0.25 MG tablet, Take 0.25 mg by mouth daily as needed., Disp: , Rfl:    citalopram (CELEXA) 40 MG tablet, Take 40 mg by mouth daily. , Disp: , Rfl: 0   lamoTRIgine (LAMICTAL) 200 MG tablet, Take 200 mg by mouth daily. , Disp: , Rfl: 1   lisinopril-hydrochlorothiazide (PRINZIDE,ZESTORETIC) 10-12.5 MG tablet, Take 1 tablet by mouth daily. , Disp: , Rfl: 0   lovastatin (MEVACOR) 40 MG tablet, Take 20 mg by mouth at bedtime. , Disp: , Rfl: 0   omeprazole (PRILOSEC) 20 MG capsule, Take 20 mg by mouth 2 (two) times daily before a meal. , Disp: , Rfl: 2   venlafaxine (EFFEXOR) 37.5 MG tablet, Take 37.5 mg by mouth 2 (two) times daily., Disp: , Rfl:    Objective:   Vitals:   06/05/21 1503  BP: (!) 144/79  Pulse: 91  SpO2:  99%    Physical Exam Vitals reviewed.  Constitutional:      Appearance: Normal appearance.  HENT:     Head: Atraumatic.  Cardiovascular:     Rate and Rhythm: Normal rate.     Pulses: Normal pulses.  Pulmonary:     Effort: Pulmonary effort is normal.  Abdominal:     General: There is no distension.     Palpations: Abdomen is soft.  Musculoskeletal:        General: No swelling or deformity.  Skin:    General: Skin is warm.     Capillary Refill: Capillary refill takes less than 2 seconds.     Coloration: Skin is not jaundiced.  Neurological:     Mental Status: She is alert and oriented to person, place, and time.  Psychiatric:        Mood and Affect: Mood  normal.        Behavior: Behavior normal.        Thought Content: Thought content normal.    Assessment & Plan:  Ruptured silicone breast implant, initial encounter  Patient would like to see if insurance will cover removal of the implants.  Then she would get a quote for mastopexy.  She will need to spend the night because her husband passed away and she does not have anybody at home.  Pictures were obtained of the patient and placed in the chart with the patient's or guardian's permission.   [name], DO

## 2021-06-05 NOTE — Telephone Encounter (Signed)
Pt called back to let us know when her last mammogram was done which was 03/26/2021 at [facility]. If any other question you can give her a call.

## 2021-06-06 ENCOUNTER — Other Ambulatory Visit (INDEPENDENT_AMBULATORY_CARE_PROVIDER_SITE_OTHER): Payer: Medicare Other

## 2021-06-06 ENCOUNTER — Encounter: Payer: Self-pay | Admitting: Physician Assistant

## 2021-06-06 ENCOUNTER — Ambulatory Visit: Payer: Medicare Other | Admitting: Physician Assistant

## 2021-06-06 VITALS — BP 147/78 | HR 110 | Resp 18 | Ht 61.0 in | Wt 132.0 lb

## 2021-06-06 DIAGNOSIS — R413 Other amnesia: Secondary | ICD-10-CM | POA: Insufficient documentation

## 2021-06-06 LAB — BETA-2 GLYCOPROTEIN ANTIBODIES
Beta-2 Glyco 1 IgA: <2 U/mL (ref <20.0)
Beta-2 Glyco 1 IgM: <2 U/mL (ref <20.0)
Beta-2 Glyco I IgG: <2 U/mL (ref <20.0)

## 2021-06-06 LAB — C3 AND C4
C3 Complement: 141 mg/dL (ref 83–193)
C4 Complement: 25 mg/dL (ref 15–57)

## 2021-06-06 LAB — LUPUS ANTICOAGULANT EVAL W/ REFLEX
PTT-LA Screen: 29 s (ref <=40)
dRVVT: 38 s (ref <=45)

## 2021-06-06 LAB — TSH: TSH: 0.85 u[IU]/mL (ref 0.35–5.50)

## 2021-06-06 LAB — SJOGRENS SYNDROME-B EXTRACTABLE NUCLEAR ANTIBODY: SSB (La) (ENA) Antibody, IgG: <1 AI

## 2021-06-06 LAB — CARDIOLIPIN ANTIBODIES, IGG, IGM, IGA
Anticardiolipin IgA: <2 APL-U/mL (ref <20.0)
Anticardiolipin IgG: <2 GPL-U/mL (ref <20.0)
Anticardiolipin IgM: <2 MPL-U/mL (ref <20.0)

## 2021-06-06 LAB — ANTI-SMITH ANTIBODY: ENA SM Ab Ser-aCnc: <1 AI

## 2021-06-06 LAB — RNP ANTIBODY: Ribonucleic Protein(ENA) Antibody, IgG: <1 AI

## 2021-06-06 LAB — SJOGRENS SYNDROME-A EXTRACTABLE NUCLEAR ANTIBODY: SSA (Ro) (ENA) Antibody, IgG: <1 AI

## 2021-06-06 LAB — ANTI-SCLERODERMA ANTIBODY: Scleroderma (Scl-70) (ENA) Antibody, IgG: <1 AI

## 2021-06-06 LAB — ANTI-DNA ANTIBODY, DOUBLE-STRANDED: ds DNA Ab: <1 IU/mL

## 2021-06-06 LAB — VITAMIN B12: Vitamin B-12: 413 pg/mL (ref 211–911)

## 2021-06-06 NOTE — Patient Instructions (Addendum)
It was a pleasure to see you today at our office.   Recommendations:  MRI of the brain, the radiology office will call you to arrange you appointment Check labs today Follow up in June 29 at 11:30   Whom to call:  Memory  decline, memory medications: Call out office [phone]   For psychiatric meds, mood meds: Please have your primary care physician manage these medications.   Counseling regarding caregiver distress, including caregiver depression, anxiety and issues regarding community resources, adult day care programs, adult living facilities, or memory care questions:   Feel free to contact Misty Lisabeth Register, Social Worker at [phone]   For assessment of decision of mental capacity and competency:  Call Dr. Erick Blinks, geriatric psychiatrist at [phone]  For guidance in geriatric dementia issues please call [facility] [phone]  For guidance regarding [facility] and if placement were needed at the facility, contact Sidney Ace, Social Worker tel: [phone]  If you have any severe symptoms of a stroke, or other severe issues such as confusion,severe chills or fever, etc call 911 or go to the ER as you may need to be evaluate further           RECOMMENDATIONS FOR ALL PATIENTS WITH MEMORY PROBLEMS: 1. Continue to exercise (Recommend 30 minutes of walking everyday, or 3 hours every week) 2. Increase social interactions - continue going to [place] and enjoy social gatherings with friends and family 3. Eat healthy, avoid fried foods and eat more fruits and vegetables 4. Maintain adequate blood pressure, blood sugar, and blood cholesterol level. Reducing the risk of stroke and cardiovascular disease also helps promoting better memory. 5. Avoid stressful situations. Live a simple life and avoid aggravations. Organize your time and prepare for the next day in anticipation. 6. Sleep well, avoid any interruptions of sleep and  avoid any distractions in the bedroom that may interfere with adequate sleep quality 7. Avoid sugar, avoid sweets as there is a strong link between excessive sugar intake, diabetes, and cognitive impairment We discussed the Mediterranean diet, which has been shown to help patients reduce the risk of progressive memory disorders and reduces cardiovascular risk. This includes eating fish, eat fruits and green leafy vegetables, nuts like almonds and hazelnuts, walnuts, and also use olive oil. Avoid fast foods and fried foods as much as possible. Avoid sweets and sugar as sugar use has been linked to worsening of memory function.  There is always a concern of gradual progression of memory problems. If this is the case, then we may need to adjust level of care according to patient needs. Support, both to the patient and caregiver, should then be put into place.      You have been referred for a neuropsychological evaluation (i.e., evaluation of memory and thinking abilities). Please bring someone with you to this appointment if possible, as it is helpful for the doctor to hear from both you and another adult who knows you well. Please bring eyeglasses and hearing aids if you wear them.    The evaluation will take approximately 3 hours and has two parts:   The first part is a clinical interview with the neuropsychologist (Dr. Milbert Coulter or Dr. Roseanne Reno). During the interview, the neuropsychologist will speak with you and the individual you brought to the appointment.    The second part of the evaluation is testing with the doctor's technician Annabelle Harman or Selena Batten). During the testing, the technician will ask you to remember different types  of material, solve problems, and answer some questionnaires. Your family member will not be present for this portion of the evaluation.   Please note: We must reserve several hours of the neuropsychologist's time and the psychometrician's time for your evaluation appointment. As such,  there is a No-Show fee of $100. If you are unable to attend any of your appointments, please contact our office as soon as possible to reschedule.    FALL PRECAUTIONS: Be cautious when walking. Scan the area for obstacles that may increase the risk of trips and falls. When getting up in the mornings, sit up at the edge of the bed for a few minutes before getting out of bed. Consider elevating the bed at the head end to avoid drop of blood pressure when getting up. Walk always in a well-lit room (use night lights in the walls). Avoid area rugs or power cords from appliances in the middle of the walkways. Use a walker or a cane if necessary and consider physical therapy for balance exercise. Get your eyesight checked regularly.  FINANCIAL OVERSIGHT: Supervision, especially oversight when making financial decisions or transactions is also recommended.  HOME SAFETY: Consider the safety of the kitchen when operating appliances like stoves, microwave oven, and blender. Consider having supervision and share cooking responsibilities until no longer able to participate in those. Accidents with firearms and other hazards in the house should be identified and addressed as well.   ABILITY TO BE LEFT ALONE: If patient is unable to contact 911 operator, consider using LifeLine, or when the need is there, arrange for someone to stay with patients. Smoking is a fire hazard, consider supervision or cessation. Risk of wandering should be assessed by caregiver and if detected at any point, supervision and safe proof recommendations should be instituted.  MEDICATION SUPERVISION: Inability to self-administer medication needs to be constantly addressed. Implement a mechanism to ensure safe administration of the medications.   DRIVING: Regarding driving, in patients with progressive memory problems, driving will be impaired. We advise to have someone else do the driving if trouble finding directions or if minor accidents  are reported. Independent driving assessment is available to determine safety of driving.   If you are interested in the driving assessment, you can contact the following:  [employer] in [place] [phone]  [facility] [phone]  [facility] [phone] or [phone]    Mediterranean Diet A Mediterranean diet refers to food and lifestyle choices that are based on the traditions of countries located on the [employer]. This way of eating has been shown to help prevent certain conditions and improve outcomes for people who have chronic diseases, like kidney disease and heart disease. What are tips for following this plan? Lifestyle  Cook and eat meals together with your family, when possible. Drink enough fluid to keep your urine clear or pale yellow. Be physically active every day. This includes: Aerobic exercise like running or swimming. Leisure activities like gardening, walking, or housework. Get 7-8 hours of sleep each night. If recommended by your health care provider, drink red wine in moderation. This means 1 glass a day for nonpregnant women and 2 glasses a day for men. A glass of wine equals 5 oz (150 mL). Reading food labels  Check the serving size of packaged foods. For foods such as rice and pasta, the serving size refers to the amount of cooked product, not dry. Check the total fat in packaged foods. Avoid foods  that have saturated fat or trans fats. Check the ingredients list for added sugars, such as corn syrup. Shopping  At the grocery store, buy most of your food from the areas near the walls of the store. This includes: Fresh fruits and vegetables (produce). Grains, beans, nuts, and seeds. Some of these may be available in unpackaged forms or large amounts (in bulk). Fresh seafood. Poultry and eggs. Low-fat dairy products. Buy whole ingredients instead of prepackaged  foods. Buy fresh fruits and vegetables in-season from local farmers markets. Buy frozen fruits and vegetables in resealable bags. If you do not have access to quality fresh seafood, buy precooked frozen shrimp or canned fish, such as tuna, salmon, or sardines. Buy small amounts of raw or cooked vegetables, salads, or olives from the deli or salad bar at your store. Stock your pantry so you always have certain foods on hand, such as olive oil, canned tuna, canned tomatoes, rice, pasta, and beans. Cooking  Cook foods with extra-virgin olive oil instead of using butter or other vegetable oils. Have meat as a side dish, and have vegetables or grains as your main dish. This means having meat in small portions or adding small amounts of meat to foods like pasta or stew. Use beans or vegetables instead of meat in common dishes like chili or lasagna. Experiment with different cooking methods. Try roasting or broiling vegetables instead of steaming or sauteing them. Add frozen vegetables to soups, stews, pasta, or rice. Add nuts or seeds for added healthy fat at each meal. You can add these to yogurt, salads, or vegetable dishes. Marinate fish or vegetables using olive oil, lemon juice, garlic, and fresh herbs. Meal planning  Plan to eat 1 vegetarian meal one day each week. Try to work up to 2 vegetarian meals, if possible. Eat seafood 2 or more times a week. Have healthy snacks readily available, such as: Vegetable sticks with hummus. Greek yogurt. Fruit and nut trail mix. Eat balanced meals throughout the week. This includes: Fruit: 2-3 servings a day Vegetables: 4-5 servings a day Low-fat dairy: 2 servings a day Fish, poultry, or lean meat: 1 serving a day Beans and legumes: 2 or more servings a week Nuts and seeds: 1-2 servings a day Whole grains: 6-8 servings a day Extra-virgin olive oil: 3-4 servings a day Limit red meat and sweets to only a few servings a month What are my food  choices? Mediterranean diet Recommended Grains: Whole-grain pasta. Brown rice. Bulgar wheat. Polenta. Couscous. Whole-wheat bread. Orpah Cobbatmeal. Quinoa. Vegetables: Artichokes. Beets. Broccoli. Cabbage. Carrots. Eggplant. Green beans. Chard. Kale. Spinach. Onions. Leeks. Peas. Squash. Tomatoes. Peppers. Radishes. Fruits: Apples. Apricots. Avocado. Berries. Bananas. Cherries. Dates. Figs. Grapes. Lemons. Melon. Oranges. Peaches. Plums. Pomegranate. Meats and other protein foods: Beans. Almonds. Sunflower seeds. Pine nuts. Peanuts. Cod. Salmon. Scallops. Shrimp. Tuna. Tilapia. Clams. Oysters. Eggs. Dairy: Low-fat milk. Cheese. Greek yogurt. Beverages: Water. Red wine. Herbal tea. Fats and oils: Extra virgin olive oil. Avocado oil. Grape seed oil. Sweets and desserts: [place] yogurt with honey. Baked apples. Poached pears. Trail mix. Seasoning and other foods: Basil. Cilantro. Coriander. Cumin. Mint. Parsley. Sage. Rosemary. Tarragon. Garlic. Oregano. Thyme. Pepper. Balsalmic vinegar. Tahini. Hummus. Tomato sauce. Olives. Mushrooms. Limit these Grains: Prepackaged pasta or rice dishes. Prepackaged cereal with added sugar. Vegetables: Deep fried potatoes (french fries). Fruits: Fruit canned in syrup. Meats and other protein foods: Beef. Pork. Lamb. Poultry with skin. Hot dogs. Tomasa BlaseBacon. Dairy: Ice cream. Sour cream. Whole milk. Beverages: Juice. Sugar-sweetened soft drinks.  Beer. Liquor and spirits. Fats and oils: Butter. Canola oil. Vegetable oil. Beef fat (tallow). Lard. Sweets and desserts: Cookies. Cakes. Pies. Candy. Seasoning and other foods: Mayonnaise. Premade sauces and marinades. The items listed may not be a complete list. Talk with your dietitian about what dietary choices are right for you. Summary The Mediterranean diet includes both food and lifestyle choices. Eat a variety of fresh fruits and vegetables, beans, nuts, seeds, and whole grains. Limit the amount of red meat and sweets that  you eat. Talk with your health care provider about whether it is safe for you to drink red wine in moderation. This means 1 glass a day for nonpregnant women and 2 glasses a day for men. A glass of wine equals 5 oz (150 mL). This information is not intended to replace advice given to you by your health care provider. Make sure you discuss any questions you have with your health care provider. Document Released: 08/17/2015 Document Revised: 09/19/2015 Document Reviewed: 08/17/2015 Elsevier Interactive Patient Education  2017 [employer].   We have sent a referral to [facility] for your MRI and they will call you directly to schedule your appointment. They are located at [address] [facility]. If you need to contact them directly please call [phone].  Your provider has requested that you have labwork completed today. Please go to [facility] ([id number]) on the second floor of this building before leaving the office today. You do not need to check in. If you are not called within 15 minutes please check with the front desk.

## 2021-06-06 NOTE — Progress Notes (Signed)
Please inform patient her blood tests are normal, but recommend anyway B12 replenishment (over the counter 1000 micrograms daily) and follow up with primary. Thanks

## 2021-06-06 NOTE — Progress Notes (Addendum)
Assessment/Plan:    The patient is seen in neurologic consultation at the request of Pollyann Savoy, MD for the evaluation of memory.  Monica Houston is a very pleasant 71 y.o. year old LH female with  a history of hypertension, hyperlipidemia, anxiety, osteoarthritis, depression, seen today for evaluation of memory loss. MoCA today is 28/30, delayed recall 5/5, visuospatial executive was 4/5.  She tried to perfection the drawing of a cube, which she could not accomplish, which frustrated her.  Otherwise, patient was quite fluent, 16 words in 1 minute.  Findings are not indicative of dementia or of mild cognitive impairment.  The patient is undergoing significant anxiety and depression due to the death of her husband and her mother within 6 months.,  And was quite anxious and tearful throughout the visit.  We talked about proceeding with MRI of the brain to rule out any structural or vascular abnormalities.  Patient is not on behavioral therapy or cognitive behavioral therapy or grief counseling at this time which could certainly be helpful.  Patient is not interested in proceeding with neurocognitive testing due to high anxiety.  Memory difficulties  MRI brain with/without contrast to assess for underlying structural abnormality and assess vascular load  Check B12, TSH Recommend cognitive behavioral therapy-grief counseling-behavioral therapy, which indeed could l result in improvement of her memory difficulties. Folllow up in 1 month to discuss the results of the MRI.  Subjective:    The patient is here alone.  How long did patient have memory difficulties?  The patient has noticed some changes, gradual, over it.  1-1/2-year.  He began by not remembering whether she took the medicine in the morning or not, or trying to remember at least of words, which brought significant anxiety to her.  In the interim, she lost her mother and her husband within the 6 months, which brought significant amount  of grief "did not help my memory ".  At one point, she got really concerned when she could not remember the vice president's first name Vonna Drafts).   Patient lives with:  Patient lives alone, her grandson stays with her to keep her company, he does not notice any changes  repeats oneself? Denies  Disoriented when walking into a room?  Patient denies   Leaving objects in unusual places?  Patient denies   Ambulates  with difficulty?  Denies.  She had recent evaluation by rheumatology, due to limited range of motion in the cervical spine and lumbar spine, and they are following her, with ongoing studies. Recent falls?  Patient denies   Any head injuries?  Patient denies   History of seizures?   Patient denies   Wandering behavior?  Patient denies   Patient drives?  No issues, patient uses GPS to drive   Any mood changes such irritability agitation?  Patient denies   Any history of depression?:  Patient denies   Hallucinations?  Patient denies   Paranoia?  Patient denies   Patient reports that he sleeps well without vivid dreams, REM behavior or sleepwalking   History of sleep apnea?  Patient denies   Any hygiene concerns?  Patient denies   Independent of bathing and dressing?  Endorsed  Does the patient needs help with medications?  The patient has a pillbox, but sometimes she forgets to open the pillbox or at times, she does not remember what the pill is.  That causes some degree of stress on her, worried that this is a "sign of dementia". Who  is in charge of the finances?  Patient is in charge, denies missing any bills.    Patient have trouble swallowing? Patient denies   Does the patient cook?  Patient denies cooking much since her husband died in Nov 05, 2022Any kitchen accidents such as leaving the stove on? Patient denies   Any headaches?  Patient denies   The double vision? Patient denies   Any focal numbness or tingling?  Patient denies   Unilateral weakness?  Patient denies   Any  tremors?  Patient denies   Any history of anosmia?  Patient denies   Any incontinence of urine?  Patient denies   Any bowel dysfunction?   Patient denies     History of heavy alcohol intake?  Patient denies   History of heavy tobacco use?  Patient denies   Family history of dementia?  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   Allergies  Allergen Reactions   Azithromycin Other (See Comments)   Sertraline Hcl Other (See Comments)    Current Outpatient Medications  Medication Instructions   ALPRAZolam (XANAX) 0.25 mg, Oral, Daily PRN   citalopram (CELEXA) 40 mg, Oral, Daily   lamoTRIgine (LAMICTAL) 200 mg, Oral, Daily   lisinopril-hydrochlorothiazide (PRINZIDE,ZESTORETIC) 10-12.5 MG tablet 1 tablet, Oral, Daily   lovastatin (MEVACOR) 20 mg, Oral, Daily at bedtime   omeprazole (PRILOSEC) 20 mg, Oral, 2 times daily before meals   venlafaxine (EFFEXOR) 37.5 mg, Oral, 2 times daily     VITALS:   Vitals:   06/06/21 1315  BP: (!) 147/78  Pulse: (!) 110  Resp: 18  SpO2: 98%  Weight: 132 lb (59.9 kg)  Height: 5\' 1"  (1.549 m)       View : No data to display.          PHYSICAL EXAM   HEENT:  Normocephalic, atraumatic. The mucous membranes are moist. The superficial temporal arteries are without ropiness or tenderness. Cardiovascular: Regular rate and rhythm. Lungs: Clear to auscultation bilaterally. Neck: There are no carotid bruits noted bilaterally.  NEUROLOGICAL:    06/06/2021    3:00 PM  Montreal Cognitive Assessment   Visuospatial/ Executive (0/5) 4  Naming (0/3) 3  Attention: Read list of digits (0/2) 2  Attention: Read list of letters (0/1) 1  Attention: Serial 7 subtraction starting at 100 (0/3) 3  Language: Repeat phrase (0/2) 1  Language : Fluency (0/1) 1  Abstraction (0/2) 2  Delayed Recall (0/5) 5  Orientation  (0/6) 6  Total 28  Adjusted Score (based on education) 28        View : No data to display.           Orientation:  Alert and oriented to person, place and time. No aphasia or dysarthria. Fund of knowledge is appropriate. Recent memory and remote memory intact.  Attention and concentration are normal.  Able to name objects and repeat phrases. Delayed recall  5/5  Cranial nerves: There is good facial symmetry. Extraocular muscles are intact and visual fields are full to confrontational testing. Speech is fluent and clear. Soft palate rises symmetrically and there is no tongue deviation. Hearing is intact to conversational tone. Tone: Tone is good throughout.limited ROM of the L shoulder  Sensation: Sensation is intact to light touch and pinprick throughout. Vibration is intact at the bilateral big toe.There is no extinction with double  simultaneous stimulation. There is no sensory dermatomal level identified. Coordination: The patient has no difficulty with RAM's or FNF bilaterally. Normal finger to nose  Motor: Strength is 5/5 in the bilateral upper and lower extremities. There is no pronator drift. There are no fasciculations noted. DTR's: Deep tendon reflexes are 2/4 at the bilateral biceps, triceps, brachioradialis, patella and achilles.  Plantar responses are downgoing bilaterally. Gait and Station: The patient is able to ambulate without difficulty.The patient is able to heel toe walk without any difficulty.The patient is able to ambulate in a tandem fashion. The patient is able to stand in the Romberg position.     Thank you for allowing us the opportunity to participate in the care of this nice patient. Please do not hesitate to contact us for any questions or concerns.   Total time spent on today's visit was 64 minutes dedicated to this patient today, preparing to see patient, examining the patient, ordering tests and/or medications and counseling the patient, documenting clinical  information in the EHR or other health record, independently interpreting results and communicating results to the patient/family, discussing treatment and goals, answering patient's questions and coordinating care.  Cc:  Rankins, Fanny DanceVictoria R, MD  Marlowe KaysSara Krizia Flight 06/06/2021 3:53 PM

## 2021-06-07 NOTE — Progress Notes (Signed)
Antibodies against Smith, RNP, SCL 70, SSA, SSB, dsDNA, lupus anticoagulant, anticardiolipin, beta-2 GP 1 are all negative, complements are normal

## 2021-06-21 ENCOUNTER — Telehealth: Payer: Self-pay

## 2021-06-21 DIAGNOSIS — L820 Inflamed seborrheic keratosis: Secondary | ICD-10-CM | POA: Diagnosis not present

## 2021-06-21 DIAGNOSIS — L989 Disorder of the skin and subcutaneous tissue, unspecified: Secondary | ICD-10-CM | POA: Diagnosis not present

## 2021-06-21 DIAGNOSIS — D485 Neoplasm of uncertain behavior of skin: Secondary | ICD-10-CM | POA: Diagnosis not present

## 2021-06-21 NOTE — Telephone Encounter (Signed)
LMVM-inquiring if the last MMG she had was in 2021 which showed BL ruptured implants

## 2021-06-28 ENCOUNTER — Telehealth: Payer: Self-pay

## 2021-06-28 NOTE — Telephone Encounter (Signed)
Faxed to [facility] [place] office notes and MMG for pre-authorization.

## 2021-06-30 ENCOUNTER — Ambulatory Visit
Admission: RE | Admit: 2021-06-30 | Discharge: 2021-06-30 | Disposition: A | Discharge status: Home / Self Care | Payer: Medicare Other | Source: Ambulatory Visit | Attending: Physician Assistant | Admitting: Physician Assistant

## 2021-07-05 ENCOUNTER — Ambulatory Visit: Payer: Medicare Other | Admitting: Physician Assistant

## 2021-07-05 ENCOUNTER — Encounter: Payer: Self-pay | Admitting: Physician Assistant

## 2021-07-05 VITALS — BP 142/82 | HR 85 | Ht 61.0 in | Wt 129.0 lb

## 2021-07-05 DIAGNOSIS — R413 Other amnesia: Secondary | ICD-10-CM

## 2021-07-05 NOTE — Progress Notes (Signed)
Assessment/Plan:   Memory difficulties  Monica Houston is a very pleasant 71 y.o. year old LH female with  a history of hypertension, hyperlipidemia, anxiety, osteoarthritis, depression, seen today for evaluation of memory loss. MoCA on 06/06/21 was 28/30. MRI brain without acute findings, mild chronic small vessel disease.   No indication for antidementia medications Follow up as needed Recommend good control of cardiovascular risk factors Recommend CBT /Psychotherapy/Grief support groups  Continue mood control meds as per PCP Continue B12 supplement    Case discussed with Dr. Karel Jarvis who agrees with the plan     Subjective:    Monica Houston is a very pleasant 71 y.o. RH female  seen today in follow up for memory loss. This patient is accompanied in the office alone.  Previous records as well as any outside records available were reviewed prior to todays visit.  Patient was last seen at our office on 06/06/21 at which time her MoCA was 28/30 She is not on antidementia medication.    Any changes in memory since last visit? "About the same, maybe a little better" Patient lives alone repeats oneself? Denies  Disoriented when walking into a room?  Patient denies   Leaving objects in unusual places?  Patient denies   Ambulates  with difficulty?   Patient denies. She has recently [facility] [employer]   Recent falls?  Patient denies   Any head injuries?  Patient denies   History of seizures?   Patient denies   Wandering behavior?  Patient denies   Patient drives?   No difficulties driving   Any mood changes?  Patient denies ." I am less of a roller coaster, I go out, started to get rid of my husband's clothes"  Any history of depression?:  Patient denies   Hallucinations?  Patient denies   Paranoia?  Patient denies   Patient reports that he sleeps well without vivid dreams, REM behavior or sleepwalking    History of sleep apnea?  Patient denies   Any hygiene concerns?  Patient  denies   Independent of bathing and dressing?  Endorsed  Does the patient needs help with medications? Patient denies   Who is in charge of the finances?  Patient is in charge  Any changes in appetite?  Patient denies   Patient have trouble swallowing? Patient denies   Does the patient cook?  Patient denies   Any kitchen accidents such as leaving the stove on? Patient denies   Any headaches?  Patient denies   The double vision? Patient denies   Any focal numbness or tingling?  Patient denies   Chronic back pain Patient denies   Unilateral weakness?  Patient denies   Any tremors?  Patient denies   Any history of anosmia?  Patient denies   Any incontinence of urine?  Patient denies   Any bowel dysfunction?   Patient denies     Initial visit 06/06/21  How long did patient have memory difficulties?  The patient has noticed some changes, gradual, over it.  1-1/2-year.  He began by not remembering whether she took the medicine in the morning or not, or trying to remember at least of words, which brought significant anxiety to her.  In the interim, she lost her mother and her husband within the 6 months, which brought significant amount of grief "did not help my memory ".  At one point, she got really concerned when she could not remember the vice president's first name Vonna Drafts).  Patient lives with:  Patient lives alone, her grandson stays with her to keep her company, he does not notice any changes  repeats oneself? Denies  Disoriented when walking into a room?  Patient denies   Leaving objects in unusual places?  Patient denies   Ambulates  with difficulty?  Denies.  She had recent evaluation by rheumatology, due to limited range of motion in the cervical spine and lumbar spine, and they are following her, with ongoing studies. Recent falls?  Patient denies   Any head injuries?  Patient denies   History of seizures?   Patient denies   Wandering behavior?  Patient denies   Patient drives?  No  issues, patient uses GPS to drive   Any mood changes such irritability agitation?  Patient denies   Any history of depression?:  Patient denies   Hallucinations?  Patient denies   Paranoia?  Patient denies   Patient reports that he sleeps well without vivid dreams, REM behavior or sleepwalking   History of sleep apnea?  Patient denies   Any hygiene concerns?  Patient denies   Independent of bathing and dressing?  Endorsed  Does the patient needs help with medications?  The patient has a pillbox, but sometimes she forgets to open the pillbox or at times, she does not remember what the pill is.  That causes some degree of stress on her, worried that this is a "sign of dementia". Who is in charge of the finances?  Patient is in charge, denies missing any bills.    Patient have trouble swallowing? Patient denies   Does the patient cook?  Patient denies cooking much since her husband died in 10/31/22Any kitchen accidents such as leaving the stove on? Patient denies   Any headaches?  Patient denies   The double vision? Patient denies   Any focal numbness or tingling?  Patient denies   Unilateral weakness?  Patient denies   Any tremors?  Patient denies   Any history of anosmia?  Patient denies   Any incontinence of urine?  Patient denies   Any bowel dysfunction?   Patient denies     History of heavy alcohol intake?  Patient denies   History of heavy tobacco use?  Patient denies   Family history of dementia?  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 No acute intracranial abnormality. 2. Findings of chronic small vessel ischemia   Past Medical History:  Diagnosis Date   Anxiety    Depression    GERD (gastroesophageal reflux disease)    Hypertension    Incomplete rotator cuff tear or rupture of left shoulder, not specified as traumatic      Past Surgical History:  Procedure Laterality Date   ABDOMINAL HYSTERECTOMY     AUGMENTATION MAMMAPLASTY  Bilateral 01/08/1983   BREAST SURGERY     breast augmentation   CESAREAN SECTION     x2   FINGER SURGERY Right    thumb and index finger   RESECTION DISTAL CLAVICAL Right 08/05/2018   Procedure: DISTAL CLAVICLE RESECTION;  Surgeon: Salvatore Marvel, MD;  Location: [facility];  Service: Orthopedics;  Laterality: Right;   SHOULDER ARTHROSCOPY WITH SUBACROMIAL DECOMPRESSION Right 08/05/2018   Procedure: SHOULDER ARTHROSCOPY DEBRIDEMENT  SUBACROMIAL DECOMPRESSION  CLAVICULECTOMY;  Surgeon: Salvatore Marvel, MD;  Location:  SUR[facility]ERY CENTER;  Service: Orthopedics;  Laterality: Right;  POST OP SCALENE   TONSILLECTOMY       PREVIOUS MEDICATIONS:  CURRENT MEDICATIONS:  Outpatient Encounter Medications as of 07/05/2021  Medication Sig   acetaminophen (TYLENOL) 325 MG tablet Take 650 mg by mouth every 6 (six) hours as needed.   ALPRAZolam (XANAX) 0.25 MG tablet Take 0.25 mg by mouth daily as needed.   citalopram (CELEXA) 40 MG tablet Take 40 mg by mouth daily.    lamoTRIgine (LAMICTAL) 200 MG tablet Take 200 mg by mouth daily.    lisinopril-hydrochlorothiazide (PRINZIDE,ZESTORETIC) 10-12.5 MG tablet Take 1 tablet by mouth daily.    lovastatin (MEVACOR) 40 MG tablet Take 20 mg by mouth at bedtime.    meloxicam (MOBIC) 15 MG tablet 1 tablet   omeprazole (PRILOSEC) 20 MG capsule Take 20 mg by mouth 2 (two) times daily before a meal.    venlafaxine (EFFEXOR) 37.5 MG tablet Take 37.5 mg by mouth 2 (two) times daily.   vitamin B-12 (CYANOCOBALAMIN) 250 MCG tablet Take 250 mcg by mouth daily.   Vitamins A & D (VITAMIN A & D) [id number] units TABS 1 tablet   No facility-administered encounter medications on file as of 07/05/2021.     Objective:     PHYSICAL EXAMINATION:    VITALS:   Vitals:   07/05/21 1111  BP: (!) 142/82  Pulse: 85  SpO2: 95%  Weight: 129 lb (58.5 kg)  Height: 5\' 1"  (1.549 m)    GEN:  The patient appears stated age and is in NAD. HEENT:   Normocephalic, atraumatic.   Neurological examination:  General: NAD, well-groomed, appears stated age. Orientation: The patient is alert. Oriented to person, place and date Cranial nerves: There is good facial symmetry.The speech is fluent and clear. No aphasia or dysarthria. Fund of knowledge is appropriate. Recent memory impaired and remote memory is normal.  Attention and concentration are normal.  Able to name objects and repeat phrases.  Hearing is intact to conversational tone.    Sensation: Sensation is intact to light touch throughout Motor: Strength is at least antigravity x4. Tremors: none  DTR's 2/4 in UE/LE      06/06/2021    3:00 PM  Montreal Cognitive Assessment   Visuospatial/ Executive (0/5) 4  Naming (0/3) 3  Attention: Read list of digits (0/2) 2  Attention: Read list of letters (0/1) 1  Attention: Serial 7 subtraction starting at 100 (0/3) 3  Language: Repeat phrase (0/2) 1  Language : Fluency (0/1) 1  Abstraction (0/2) 2  Delayed Recall (0/5) 5  Orientation (0/6) 6  Total 28  Adjusted Score (based on education) 28        No data to display             Movement examination: Tone: There is normal tone in the UE/LE Abnormal movements:  no tremor.  No myoclonus.  No asterixis.   Coordination:  There is no decremation with RAM's. Normal finger to nose  Gait and Station: The patient has no difficulty arising out of a deep-seated chair without the use of the hands. The patient's stride length is good.  Gait is cautious and narrow.   Thank you for allowing [place] the opportunity to participate in the care of this nice patient. Please do not hesitate to contact us for any questions or concerns.   Total time spent on today's visit was 32 minutes dedicated to this patient today, preparing to see patient, examining the patient, ordering tests and/or medications and counseling the patient, documenting clinical information in the EHR or other health record,  independently interpreting results and  communicating results to the patient/family, discussing treatment and goals, answering patient's questions and coordinating care.  Cc:  Rankins, Bill Salinas, MD  Sharene Butters 07/09/2021 7:50 PM

## 2021-07-09 ENCOUNTER — Ambulatory Visit (INDEPENDENT_AMBULATORY_CARE_PROVIDER_SITE_OTHER): Payer: Medicare Other | Admitting: Plastic Surgery

## 2021-07-09 ENCOUNTER — Encounter: Payer: Self-pay | Admitting: Plastic Surgery

## 2021-07-09 DIAGNOSIS — T8543XA Leakage of breast prosthesis and implant, initial encounter: Secondary | ICD-10-CM | POA: Diagnosis not present

## 2021-07-09 NOTE — Progress Notes (Signed)
   Subjective:    Patient ID: Monica Houston, female    DOB: 1950-04-16, 71 y.o.   MRN: [id number]  The patient is a 71 year old female joining me by phone for further discussion about her breasts.  The patient is 5 feet tall and weighs 132 pounds.  She had silicone implants placed in her breast over 35 years ago.  They were placed through an inframammary incision.  The patient was notified on her last mammogram that there is an extracapsular rupture.  She is otherwise in good health and is trying to decide whether or not to have the implants removed.  She is not a smoker and does not have diabetes.  She has had several surgeries in the past and recently lost her husband and her mother and has been busy trying to settle the estate.  When I saw her in May she was trying to decide on course of action.  Today she states that she would like to have the implants removed and a mastopexy described as a left.  She does not want to have new implants placed.       Review of Systems     Objective:   Physical Exam      Assessment & Plan:     ICD-10-CM   1. Ruptured silicone breast implant, initial encounter  T85.43XA        I connected with  Monica Houston on 07/09/21 by phone and verified that I am speaking with the correct person using two identifiers.  The patient was not at home but was in [place] as well as I.  We spent 5 minutes in discussion. The patient would like to have the implants removed and a mastopexy.  She would like a quote for the mastopexy.  She also might be interested in a blepharoplasty.  I encouraged her to give [place] a call back in a few weeks to hear what insurance decided.   I discussed the limitations of evaluation and management by telemedicine. The patient expressed understanding and agreed to proceed.

## 2021-07-09 NOTE — Progress Notes (Deleted)
   Subjective:    Patient ID: Monica Houston, female    DOB: October 04, 1950, 71 y.o.   MRN: [id number]  The patent is a 71 yrs old female joining me by phone.    The patient is a 71 year old female here for evaluation of her breast.  She is 5 feet tall and weighs 132 pounds.  She had silicone breast implants placed 35 years ago.  She is not sure if they are under the muscle or the size.  They were placed through an inframammary incision.  They are ruptured according to the mammogram and by physical exam on both sides.  The mammogram shows submammary placement with extracapsular rupture.  She does not have a history of diabetes or history of breast cancer.  The implants are not painful and she is not a smoker.  They are firm and it is concerning that it could get worse.  She has had several surgeries in the past including a C-section, hysterectomy, finger surgery and a shoulder surgery.  She lost her husband last year as well as her mother.  She has been having a lot to deal with with arranging the estate.        Review of Systems     Objective:   Physical Exam        Assessment & Plan:

## 2021-07-13 ENCOUNTER — Telehealth: Payer: Medicare Other | Admitting: Plastic Surgery

## 2021-07-16 ENCOUNTER — Encounter: Payer: Self-pay | Admitting: *Deleted

## 2021-07-19 ENCOUNTER — Telehealth: Payer: Self-pay | Admitting: *Deleted

## 2021-07-19 ENCOUNTER — Telehealth: Payer: Self-pay

## 2021-07-19 NOTE — Telephone Encounter (Signed)
Called patient and advised insurance authorized her removal of both implants and capsulectomy surgery Authorization#[id number].  She does not know if she wants to do the mastopexy at this time.  She is also inquiring about a quote  upper/lower blephs. Advised her I will send a referral to Dr. Denman George.

## 2021-07-19 NOTE — Telephone Encounter (Signed)
Pt called in response to receiving quote for mastopexy and asked if insurance had approved implant removal. Explained that was still in process and we would contact her when we have more information from her insurance company. Pt verbalized understanding

## 2021-07-20 ENCOUNTER — Telehealth: Payer: Self-pay

## 2021-07-20 ENCOUNTER — Other Ambulatory Visit: Payer: Self-pay

## 2021-07-20 DIAGNOSIS — H02831 Dermatochalasis of right upper eyelid: Secondary | ICD-10-CM

## 2021-07-20 NOTE — Telephone Encounter (Signed)
Faxed referral to Dr. Lexine Baton for VFT

## 2021-07-24 ENCOUNTER — Telehealth: Payer: Self-pay

## 2021-07-24 NOTE — Telephone Encounter (Signed)
Called patient, LMVM inquiring if she wants to move forward with the breast surgery and schedule, or wait until I get her VFT results, submit to her insurance company to see if the will authorize the eye surgery as well and do at the same time.  Patient is now extended from 7/1-9/15 Authorizaiton#[id number]  Cpt codes 86767, [id number], [id number], [id number]

## 2021-07-30 ENCOUNTER — Telehealth: Payer: Self-pay | Admitting: *Deleted

## 2021-07-30 NOTE — Telephone Encounter (Unsigned)
Call received from pt regarding quote for eye surgery (upper and lower bleph). Reviewed quote with pt on phone and will send via mychart. Pt asked if before and after pictures of blepharoplasty were available. Message forwarded to Dr. Ulice Bold

## 2021-07-31 ENCOUNTER — Encounter: Payer: Self-pay | Admitting: *Deleted

## 2021-10-21 DIAGNOSIS — M5416 Radiculopathy, lumbar region: Secondary | ICD-10-CM | POA: Diagnosis not present

## 2021-10-23 DIAGNOSIS — Z1211 Encounter for screening for malignant neoplasm of colon: Secondary | ICD-10-CM | POA: Diagnosis not present

## 2021-10-23 DIAGNOSIS — R1013 Epigastric pain: Secondary | ICD-10-CM | POA: Diagnosis not present

## 2021-10-23 DIAGNOSIS — K219 Gastro-esophageal reflux disease without esophagitis: Secondary | ICD-10-CM | POA: Diagnosis not present

## 2021-11-01 DIAGNOSIS — M5416 Radiculopathy, lumbar region: Secondary | ICD-10-CM | POA: Diagnosis not present

## 2021-11-03 ENCOUNTER — Ambulatory Visit: Payer: Medicare Other | Admitting: Podiatry

## 2021-11-03 DIAGNOSIS — L97522 Non-pressure chronic ulcer of other part of left foot with fat layer exposed: Secondary | ICD-10-CM

## 2021-11-03 DIAGNOSIS — R52 Pain, unspecified: Secondary | ICD-10-CM | POA: Diagnosis not present

## 2021-11-03 MED ORDER — MUPIROCIN 2 % EX OINT: 1.0000 | TOPICAL_OINTMENT | Freq: Two times a day (BID) | CUTANEOUS | 1 refills | Status: AC

## 2021-11-03 MED ORDER — DOXYCYCLINE HYCLATE 100 MG PO TABS: 100.0000 mg | ORAL_TABLET | Freq: Two times a day (BID) | ORAL | 0 refills | Status: DC

## 2021-11-03 NOTE — Progress Notes (Signed)
Chief Complaint  Patient presents with   Toe Pain    Sore place between 4th and 5th toe     HPI: 71 y.o. female presenting today for new complaint of pain and tenderness associated to the medial aspect of the fifth toe adjacent the fourth digit of the left foot.  She does have a history of arthroplasty to the fifth digit of the right foot for the same condition.  Patient states over the past months she has had pain and tenderness associated to the interdigital area on the left foot.  She does work at a [employer] and wears high-heeled shoes and closed toe pointy dress shoes.  Currently she has not done anything for treatment  Past Medical History:  Diagnosis Date   Anxiety    Depression    GERD (gastroesophageal reflux disease)    Hypertension    Incomplete rotator cuff tear or rupture of left shoulder, not specified as traumatic     Past Surgical History:  Procedure Laterality Date   ABDOMINAL HYSTERECTOMY     AUGMENTATION MAMMAPLASTY Bilateral 01/08/1983   BREAST SURGERY     breast augmentation   CESAREAN SECTION     x2   FINGER SURGERY Right    thumb and index finger   RESECTION DISTAL CLAVICAL Right 08/05/2018   Procedure: DISTAL CLAVICLE RESECTION;  Surgeon: Elsie Saas, MD;  Location: [facility];  Service: Orthopedics;  Laterality: Right;   SHOULDER ARTHROSCOPY WITH SUBACROMIAL DECOMPRESSION Right 08/05/2018   Procedure: SHOULDER ARTHROSCOPY DEBRIDEMENT  SUBACROMIAL DECOMPRESSION  CLAVICULECTOMY;  Surgeon: Elsie Saas, MD;  Location: [facility];  Service: Orthopedics;  Laterality: Right;  POST OP SCALENE   TONSILLECTOMY      Allergies  Allergen Reactions   Azithromycin Other (See Comments)   Sertraline Hcl Other (See Comments)      Physical Exam: General: The patient is alert and oriented x3 in no acute distress.  Dermatology: Ulcer noted medial aspect of the left fifth digit adjacent to fourth toe at the base of the  interdigital webspace measuring approximately 0.5 x 0.5 x 0.2 cm.  Please see above noted photo.  This does not extend into the bone.  Wound base appears mostly granular.  Scant serous drainage noted.  Vascular: Palpable pedal pulses bilaterally. Capillary refill within normal limits.  Negative for any significant edema or erythema.  Clinically no concern for vascular compromise  Neurological: Grossly intact via light touch  Musculoskeletal Exam: No pedal deformities noted  Assessment: 1.  Ulcer fifth digit left foot   Plan of Care:  1. Patient evaluated. X-Rays reviewed.  2.  Medically necessary excisional debridement including subcutaneous tissue was performed using a tissue nipper.  Excisional debridement of the necrotic nonviable tissue down to healthier bleeding viable tissue was performed with postdebridement measurement same as pre- 3.  Prescription for gentamicin cream apply 2 times daily with a Band-Aid 4.  Prescription for doxycycline 100 mg 2 times daily #20 5.  Advised against close toed pointy shoes.  Recommend wide fitting shoes or open toed sandals that do not constrict the toebox area 6.  Return to clinic 3 weeks      Edrick Kins, DPM [facility]  Dr. Edrick Kins, DPM    [address]  [place], [place] [address]                Office [phone]  Fax [phone]

## 2021-11-09 DIAGNOSIS — Z6824 Body mass index (BMI) 24.0-24.9, adult: Secondary | ICD-10-CM | POA: Diagnosis not present

## 2021-11-09 DIAGNOSIS — K1370 Unspecified lesions of oral mucosa: Secondary | ICD-10-CM | POA: Diagnosis not present

## 2021-11-12 ENCOUNTER — Telehealth: Payer: Self-pay | Admitting: Podiatry

## 2021-11-12 NOTE — Telephone Encounter (Signed)
Pt has missed placed her insurance card and the last place she had it was here last week.   Please call pt if anyone finds her card. I did ask the front desk and they said no one had found one and I notified pt.

## 2021-11-21 ENCOUNTER — Ambulatory Visit: Payer: Medicare Other | Admitting: Podiatry

## 2021-11-21 DIAGNOSIS — L97522 Non-pressure chronic ulcer of other part of left foot with fat layer exposed: Secondary | ICD-10-CM

## 2021-11-21 NOTE — Progress Notes (Signed)
   Chief Complaint  Patient presents with   Follow-up    Patient is here for left foot follow-up, the patient states that left foot feels the better.    HPI: 71 y.o. female presenting today for follow-up evaluation of an ulcer to the fifth digit interdigital area of the left foot.  Patient states that she is doing much better.  She has been applying antibiotic cream as instructed.  No new complaints at this time  Past Medical History:  Diagnosis Date   Anxiety    Depression    GERD (gastroesophageal reflux disease)    Hypertension    Incomplete rotator cuff tear or rupture of left shoulder, not specified as traumatic     Past Surgical History:  Procedure Laterality Date   ABDOMINAL HYSTERECTOMY     AUGMENTATION MAMMAPLASTY Bilateral 01/08/1983   BREAST SURGERY     breast augmentation   CESAREAN SECTION     x2   FINGER SURGERY Right    thumb and index finger   RESECTION DISTAL CLAVICAL Right 08/05/2018   Procedure: DISTAL CLAVICLE RESECTION;  Surgeon: Salvatore Marvel, MD;  Location: [facility];  Service: Orthopedics;  Laterality: Right;   SHOULDER ARTHROSCOPY WITH SUBACROMIAL DECOMPRESSION Right 08/05/2018   Procedure: SHOULDER ARTHROSCOPY DEBRIDEMENT  SUBACROMIAL DECOMPRESSION  CLAVICULECTOMY;  Surgeon: Salvatore Marvel, MD;  Location: [facility];  Service: Orthopedics;  Laterality: Right;  POST OP SCALENE   TONSILLECTOMY      Allergies  Allergen Reactions   Azithromycin Other (See Comments)   Sertraline Hcl Other (See Comments)     Physical Exam: General: The patient is alert and oriented x3 in no acute distress.  Dermatology: Ulcer noted medial aspect of the left fifth digit has healed.  Complete reepithelialization has occurred.  No open wound noted today  Vascular: Palpable pedal pulses bilaterally. Capillary refill within normal limits.  Negative for any significant edema or erythema.  Clinically no concern for vascular  compromise  Neurological: Grossly intact via light touch  Musculoskeletal Exam: No pedal deformities noted  Assessment: 1.  Ulcer fifth digit left foot; resolved   Plan of Care:  1. Patient evaluated.  2.  Light debridement of some callus tissue to the area was performed today.  No bleeding.  Again, no open wound. 3.  Recommend wide fitting shoes that do not constrict the toebox area 4.  Return to clinic as needed     Felecia Shelling, DPM [facility]  Dr. Felecia Shelling, DPM    [address] [place], [place] [address]                Office [phone]  Fax [phone]

## 2021-11-26 DIAGNOSIS — K1329 Other disturbances of oral epithelium, including tongue: Secondary | ICD-10-CM | POA: Diagnosis not present

## 2021-11-26 DIAGNOSIS — K1370 Unspecified lesions of oral mucosa: Secondary | ICD-10-CM | POA: Diagnosis not present

## 2021-11-26 DIAGNOSIS — K1379 Other lesions of oral mucosa: Secondary | ICD-10-CM | POA: Diagnosis not present

## 2021-12-11 DIAGNOSIS — C44619 Basal cell carcinoma of skin of left upper limb, including shoulder: Secondary | ICD-10-CM | POA: Diagnosis not present

## 2021-12-11 DIAGNOSIS — L821 Other seborrheic keratosis: Secondary | ICD-10-CM | POA: Diagnosis not present

## 2021-12-11 DIAGNOSIS — L820 Inflamed seborrheic keratosis: Secondary | ICD-10-CM | POA: Diagnosis not present

## 2021-12-11 DIAGNOSIS — D0361 Melanoma in situ of right upper limb, including shoulder: Secondary | ICD-10-CM | POA: Diagnosis not present

## 2021-12-11 DIAGNOSIS — L814 Other melanin hyperpigmentation: Secondary | ICD-10-CM | POA: Diagnosis not present

## 2021-12-11 DIAGNOSIS — D2372 Other benign neoplasm of skin of left lower limb, including hip: Secondary | ICD-10-CM | POA: Diagnosis not present

## 2021-12-14 DIAGNOSIS — E7800 Pure hypercholesterolemia, unspecified: Secondary | ICD-10-CM | POA: Diagnosis not present

## 2021-12-14 DIAGNOSIS — I10 Essential (primary) hypertension: Secondary | ICD-10-CM | POA: Diagnosis not present

## 2021-12-14 DIAGNOSIS — Z0000 Encounter for general adult medical examination without abnormal findings: Secondary | ICD-10-CM | POA: Diagnosis not present

## 2021-12-14 DIAGNOSIS — E559 Vitamin D deficiency, unspecified: Secondary | ICD-10-CM | POA: Diagnosis not present

## 2021-12-14 DIAGNOSIS — F339 Major depressive disorder, recurrent, unspecified: Secondary | ICD-10-CM | POA: Diagnosis not present

## 2021-12-25 DIAGNOSIS — D2372 Other benign neoplasm of skin of left lower limb, including hip: Secondary | ICD-10-CM | POA: Diagnosis not present

## 2021-12-25 DIAGNOSIS — C44619 Basal cell carcinoma of skin of left upper limb, including shoulder: Secondary | ICD-10-CM | POA: Diagnosis not present

## 2021-12-25 DIAGNOSIS — C4361 Malignant melanoma of right upper limb, including shoulder: Secondary | ICD-10-CM | POA: Diagnosis not present

## 2022-01-16 DIAGNOSIS — C4361 Malignant melanoma of right upper limb, including shoulder: Secondary | ICD-10-CM | POA: Diagnosis not present

## 2022-01-16 DIAGNOSIS — L905 Scar conditions and fibrosis of skin: Secondary | ICD-10-CM | POA: Diagnosis not present

## 2022-01-30 DIAGNOSIS — L905 Scar conditions and fibrosis of skin: Secondary | ICD-10-CM | POA: Diagnosis not present

## 2022-01-30 DIAGNOSIS — C44619 Basal cell carcinoma of skin of left upper limb, including shoulder: Secondary | ICD-10-CM | POA: Diagnosis not present

## 2022-02-06 DIAGNOSIS — K080 Exfoliation of teeth due to systemic causes: Secondary | ICD-10-CM | POA: Diagnosis not present

## 2022-02-25 DIAGNOSIS — M5030 Other cervical disc degeneration, unspecified cervical region: Secondary | ICD-10-CM | POA: Diagnosis not present

## 2022-02-25 DIAGNOSIS — M79602 Pain in left arm: Secondary | ICD-10-CM | POA: Diagnosis not present

## 2022-03-21 DIAGNOSIS — N6489 Other specified disorders of breast: Secondary | ICD-10-CM | POA: Diagnosis not present

## 2022-04-02 ENCOUNTER — Encounter: Payer: Self-pay | Admitting: Podiatry

## 2022-04-02 ENCOUNTER — Ambulatory Visit: Payer: Medicare Other | Admitting: Podiatry

## 2022-04-02 DIAGNOSIS — L97522 Non-pressure chronic ulcer of other part of left foot with fat layer exposed: Secondary | ICD-10-CM | POA: Diagnosis not present

## 2022-04-02 NOTE — Progress Notes (Signed)
  Subjective:  Patient ID: Monica Houston, female    DOB: 1950-05-24,   MRN: [id number]  Chief Complaint  Patient presents with   Toe Pain    Follow up 5th left toe and interdigital space   "Its flared back up again. It's probably because he told me not to wear heels"    72 y.o. female presents for concern of left foot pain. She has dealt with a wound on the left toe in the past and it had healed, but has now returned.   Denies any other pedal complaints. Denies n/v/f/c.   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 CFT <3 seconds. Normal hair growth on digits. No edema.  Skin. No lacerations or abrasions bilateral feet. Ulceration noted to medial left fifth digit about 0.5 cm x 0.5 cm x 0.2 cm. No erythema edema or purulence noted. No probe to bone  Musculoskeletal: MMT 5/5 bilateral lower extremities in DF, PF, Inversion and Eversion. Deceased ROM in DF of ankle joint.  Neurological: Sensation intact to light touch.   Assessment:   1. Ulcer of toe, left, with fat layer exposed ([place])      Plan:  Patient was evaluated and treated and all questions answered. Ulcer left fifth digit with fat layer exposed  -Debridement as below. -Dressed with betadine, DSD. -Off-loading with sandal -No abx indicated.  -Discussed glucose control and proper protein-rich diet.  -Discussed if any worsening redness, pain, fever or chills to call or may need to report to the emergency room. Patient expressed understanding.   Procedure: Excisional Debridement of Wound Rationale: Removal of non-viable soft tissue from the wound to promote healing.  Anesthesia: none Pre-Debridement Wound Measurements: Overlying slough Post-Debridement Wound Measurements: 0.5 cm x 0.5 cm x  0.2 cm  Type of Debridement: Sharp Excisional Tissue Removed: Non-viable soft tissue Depth of Debridement: subcutaneous tissue. Technique: Sharp excisional debridement to bleeding, viable wound base.  Dressing: Dry, sterile, compression dressing. Disposition: Patient tolerated procedure well. Patient to return in 2 week for follow-up.  Return in about 2 weeks (around 04/16/2022) for wound check.   Lorenda Peck, DPM

## 2022-04-16 ENCOUNTER — Ambulatory Visit: Payer: Medicare Other | Admitting: Podiatry

## 2022-04-16 ENCOUNTER — Encounter: Payer: Self-pay | Admitting: Podiatry

## 2022-04-16 DIAGNOSIS — L97522 Non-pressure chronic ulcer of other part of left foot with fat layer exposed: Secondary | ICD-10-CM | POA: Diagnosis not present

## 2022-04-16 NOTE — Progress Notes (Signed)
  Subjective:  Patient ID: Monica Houston, female    DOB: 09-Aug-1950,   MRN: [id number]  Chief Complaint  Patient presents with   Foot Ulcer    Left foot 5th toe ulcer, some redness and swelling, patient states it hurts to put on a closed shoe     72 y.o. female presents for  follow-up of left toe wound. Relates doing some better but still hurting. Realtes she has been dressing as instructed.   Denies any other pedal complaints. Denies n/v/f/c.   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 CFT <3 seconds. Normal hair growth on digits. No edema.  Skin. No lacerations or abrasions bilateral feet. Ulceration noted to medial left fifth digit about 0.4 cm x 0.4 cm x 0.2 cm. No erythema edema or purulence noted. No probe to bone  Musculoskeletal: MMT 5/5 bilateral lower extremities in DF, PF, Inversion and Eversion. Deceased ROM in DF of ankle joint.  Neurological: Sensation intact to light touch.   Assessment:   1. Ulcer of toe, left, with fat layer exposed       Plan:  Patient was evaluated and treated and all questions answered. Ulcer left fifth digit with fat layer exposed  -Debridement as below. -Dressed with betadine, DSD. -Off-loading with sandal -No abx indicated.  -Discussed avoiding pedicure and soaks for now to avoid infection.  -Discussed glucose control and proper protein-rich diet.  -Discussed if any worsening redness, pain, fever or chills to call or may need to report to the emergency room. Patient expressed understanding.   Procedure: Excisional Debridement of Wound Rationale: Removal of non-viable soft tissue from the wound to promote healing.  Anesthesia: none Pre-Debridement Wound Measurements: Overlying slough Post-Debridement  Wound Measurements: 0.4 cm x 0.4 cm x 0.2 cm  Type of Debridement: Sharp Excisional Tissue Removed: Non-viable soft tissue Depth of Debridement: subcutaneous tissue. Technique: Sharp excisional debridement to bleeding, viable wound base.  Dressing: Dry, sterile, compression dressing. Disposition: Patient tolerated procedure well. Patient to return in 2 week for follow-up.  No follow-ups on file.   Louann Sjogren, DPM

## 2022-04-25 DIAGNOSIS — M5459 Other low back pain: Secondary | ICD-10-CM | POA: Diagnosis not present

## 2022-04-25 DIAGNOSIS — M5416 Radiculopathy, lumbar region: Secondary | ICD-10-CM | POA: Diagnosis not present

## 2022-04-25 DIAGNOSIS — M5412 Radiculopathy, cervical region: Secondary | ICD-10-CM | POA: Diagnosis not present

## 2022-05-07 ENCOUNTER — Ambulatory Visit: Payer: Medicare Other | Admitting: Podiatry

## 2022-05-07 ENCOUNTER — Encounter: Payer: Self-pay | Admitting: Podiatry

## 2022-05-07 DIAGNOSIS — L84 Corns and callosities: Secondary | ICD-10-CM | POA: Diagnosis not present

## 2022-05-07 DIAGNOSIS — L97522 Non-pressure chronic ulcer of other part of left foot with fat layer exposed: Secondary | ICD-10-CM | POA: Diagnosis not present

## 2022-05-07 NOTE — Progress Notes (Signed)
  Subjective:  Patient ID: Monica Houston, female    DOB: [birth year],   MRN: [id number]  Chief Complaint  Patient presents with   Wound Check    Wound check on the left foot, in between the 4th and 5th digit, no drainage, pain only when wearing shoes     73 y.o. female presents for  follow-up of left toe wound. Relates doing some better has been wearing more sandals.  Realtes she has been dressing as instructed.   Denies any other pedal complaints. Denies n/v/f/c.   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 CFT <3 seconds. Normal hair growth on digits. No edema.  Skin. No lacerations or abrasions bilateral feet. Ulceration noted to medial left fifth digit healed.  Musculoskeletal: MMT 5/5 bilateral lower extremities in DF, PF, Inversion and Eversion. Deceased ROM in DF of ankle joint.  Neurological: Sensation intact to light touch.   Assessment:   1. Pre-ulcerative calluses   2. Ulcer of toe, left, with fat layer exposed (HCC)   3. Heloma molle        Plan:  Patient was evaluated and treated and all questions answered. Ulcer left fifth digit -healed.  -Debridement of hyperkeratotic tissue without inicdent with underlying ulceration healed.  -Dressed with bandaid for cushion.  -Off-loading with sandal -No abx indicated.  -Discussed avoiding pedicure and soaks for now to avoid infection.  -Discussed glucose control and proper protein-rich diet.  -Discussed if any worsening redness, pain, fever or chills to call or may need to report to the emergency room. Patient expressed understanding.  Discussed hopefully will remain well in summer with sandals but discuss wearing wide toe shoes, but if continues to have pain and issues could consider surgery  on this toe including shaving of bone vs syndactilization.  Patient to return as needed.     Return if symptoms worsen or fail to improve.   Louann Sjogren, DPM

## 2022-05-15 DIAGNOSIS — L570 Actinic keratosis: Secondary | ICD-10-CM | POA: Diagnosis not present

## 2022-05-15 DIAGNOSIS — D2372 Other benign neoplasm of skin of left lower limb, including hip: Secondary | ICD-10-CM | POA: Diagnosis not present

## 2022-05-15 DIAGNOSIS — L821 Other seborrheic keratosis: Secondary | ICD-10-CM | POA: Diagnosis not present

## 2022-05-15 DIAGNOSIS — Z85820 Personal history of malignant melanoma of skin: Secondary | ICD-10-CM | POA: Diagnosis not present

## 2022-05-15 DIAGNOSIS — L814 Other melanin hyperpigmentation: Secondary | ICD-10-CM | POA: Diagnosis not present

## 2022-05-16 DIAGNOSIS — M5416 Radiculopathy, lumbar region: Secondary | ICD-10-CM | POA: Diagnosis not present

## 2022-05-21 DIAGNOSIS — C4361 Malignant melanoma of right upper limb, including shoulder: Secondary | ICD-10-CM | POA: Diagnosis not present

## 2022-05-23 NOTE — Progress Notes (Deleted)
Office Visit Note  Patient: Monica Houston             Date of Birth: April 04, 1950           MRN: [id number]             PCP: Clayborn Heron, MD Referring: Clayborn Heron, MD Visit Date: 06/06/2022 Occupation: @GUAROCC @  Subjective:  No chief complaint on file.   History of Present Illness: Monica Houston is a 72 y.o. female ***     Activities of Daily Living:  Patient reports morning stiffness for *** {minute/hour:19697}.   Patient {ACTIONS;DENIES/REPORTS:21021675::"Denies"} nocturnal pain.  Difficulty dressing/grooming: {ACTIONS;DENIES/REPORTS:21021675::"Denies"} Difficulty climbing stairs: {ACTIONS;DENIES/REPORTS:21021675::"Denies"} Difficulty getting out of chair: {ACTIONS;DENIES/REPORTS:21021675::"Denies"} Difficulty using hands for taps, buttons, cutlery, and/or writing: {ACTIONS;DENIES/REPORTS:21021675::"Denies"}  No Rheumatology ROS completed.   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, not specified as traumatic     Family History  Problem Relation Age of Onset   Hypertension Mother    Heart Problems Mother    Cerebral palsy Sister    Healthy Daughter    Healthy Daughter    Past Surgical History:  Procedure Laterality Date   ABDOMINAL HYSTERECTOMY     AUGMENTATION MAMMAPLASTY Bilateral 01/08/1983   BREAST SURGERY     breast augmentation   CESAREAN SECTION     x2   FINGER SURGERY Right    thumb and index finger   RESECTION DISTAL CLAVICAL  Right 08/05/2018   Procedure: DISTAL CLAVICLE RESECTION;  Surgeon: Salvatore Marvel, MD;  Location: [facility];  Service: Orthopedics;  Laterality: Right;   SHOULDER ARTHROSCOPY WITH SUBACROMIAL DECOMPRESSION Right 08/05/2018   Procedure: SHOULDER ARTHROSCOPY DEBRIDEMENT  SUBACROMIAL DECOMPRESSION  CLAVICULECTOMY;  Surgeon: Salvatore Marvel, MD;  Location: [facility];  Service: Orthopedics;  Laterality: Right;  POST OP SCALENE   TONSILLECTOMY     Social History   Social History Narrative   Right handed   Drinks caffeine   One story home   Immunization History  Administered Date(s) Administered   PFIZER(Purple Top)SARS-COV-2 Vaccination 11/12/2019     Objective: Vital Signs: There were no vitals taken for this visit.   Physical Exam   Musculoskeletal Exam: ***  CDAI Exam: CDAI Score: -- Patient Global: --; Provider Global: -- Swollen: --; Tender: -- Joint Exam 06/06/2022   No joint exam has been documented for this visit   There is currently no information documented on the homunculus. Go to the Rheumatology activity and complete the homunculus joint exam.  Investigation: No additional findings.  Imaging: No results found.  Recent Labs: Lab Results  Component Value Date   WBC 9.7 05/22/2021   HGB 13.1 05/22/2021   PLT 288 05/22/2021   NA 140 05/22/2021   K 4.6 05/22/2021   CL 103 05/22/2021   CO2 27 05/22/2021   GLUCOSE 92 05/22/2021   BUN 18 05/22/2021   CREATININE 0.87 05/22/2021   BILITOT  0.5 05/22/2021   ALKPHOS 76 10/27/2010   AST 14 05/22/2021   ALT 13 05/22/2021   PROT 6.9 05/22/2021   ALBUMIN 4.2 10/27/2010   CALCIUM 9.7 05/22/2021   GFRAA >60 08/05/2018    Speciality Comments: No specialty comments available.  Procedures:  No procedures performed Allergies: Azithromycin and Sertraline hcl   Assessment / Plan:     Visit Diagnoses: No diagnosis found.  Orders: No orders of the defined types were placed in this  encounter.  No orders of the defined types were placed in this encounter.   Face-to-face time spent with patient was *** minutes. Greater than 50% of time was spent in counseling and coordination of care.  Follow-Up Instructions: No follow-ups on file.   Ellen Henri, CMA  Note - This record has been created using Animal nutritionist.  Chart creation errors have been sought, but may not always  have been located. Such creation errors do not reflect on  the standard of medical care.

## 2022-06-06 ENCOUNTER — Ambulatory Visit: Payer: Medicare Other | Admitting: Rheumatology

## 2022-06-06 DIAGNOSIS — M5030 Other cervical disc degeneration, unspecified cervical region: Secondary | ICD-10-CM

## 2022-06-06 DIAGNOSIS — E7800 Pure hypercholesterolemia, unspecified: Secondary | ICD-10-CM

## 2022-06-06 DIAGNOSIS — E559 Vitamin D deficiency, unspecified: Secondary | ICD-10-CM

## 2022-06-06 DIAGNOSIS — R768 Other specified abnormal immunological findings in serum: Secondary | ICD-10-CM

## 2022-06-06 DIAGNOSIS — F32A Depression, unspecified: Secondary | ICD-10-CM

## 2022-06-06 DIAGNOSIS — I7300 Raynaud's syndrome without gangrene: Secondary | ICD-10-CM

## 2022-06-06 DIAGNOSIS — M75111 Incomplete rotator cuff tear or rupture of right shoulder, not specified as traumatic: Secondary | ICD-10-CM

## 2022-06-06 DIAGNOSIS — Z8739 Personal history of other diseases of the musculoskeletal system and connective tissue: Secondary | ICD-10-CM

## 2022-06-06 DIAGNOSIS — H9313 Tinnitus, bilateral: Secondary | ICD-10-CM

## 2022-06-06 DIAGNOSIS — Z8589 Personal history of malignant neoplasm of other organs and systems: Secondary | ICD-10-CM

## 2022-06-06 DIAGNOSIS — M19041 Primary osteoarthritis, right hand: Secondary | ICD-10-CM

## 2022-06-06 DIAGNOSIS — M5136 Other intervertebral disc degeneration, lumbar region: Secondary | ICD-10-CM

## 2022-06-06 DIAGNOSIS — R413 Other amnesia: Secondary | ICD-10-CM

## 2022-06-06 DIAGNOSIS — I10 Essential (primary) hypertension: Secondary | ICD-10-CM

## 2022-06-06 DIAGNOSIS — Z8719 Personal history of other diseases of the digestive system: Secondary | ICD-10-CM

## 2022-06-06 DIAGNOSIS — M25512 Pain in left shoulder: Secondary | ICD-10-CM

## 2022-06-09 DIAGNOSIS — R197 Diarrhea, unspecified: Secondary | ICD-10-CM | POA: Diagnosis not present

## 2022-06-09 DIAGNOSIS — R112 Nausea with vomiting, unspecified: Secondary | ICD-10-CM | POA: Diagnosis not present

## 2022-06-09 DIAGNOSIS — Z03818 Encounter for observation for suspected exposure to other biological agents ruled out: Secondary | ICD-10-CM | POA: Diagnosis not present

## 2022-08-07 DIAGNOSIS — N390 Urinary tract infection, site not specified: Secondary | ICD-10-CM | POA: Diagnosis not present

## 2022-08-13 DIAGNOSIS — K080 Exfoliation of teeth due to systemic causes: Secondary | ICD-10-CM | POA: Diagnosis not present

## 2022-08-21 DIAGNOSIS — L814 Other melanin hyperpigmentation: Secondary | ICD-10-CM | POA: Diagnosis not present

## 2022-08-21 DIAGNOSIS — L570 Actinic keratosis: Secondary | ICD-10-CM | POA: Diagnosis not present

## 2022-08-21 DIAGNOSIS — L821 Other seborrheic keratosis: Secondary | ICD-10-CM | POA: Diagnosis not present

## 2022-08-21 DIAGNOSIS — Z85820 Personal history of malignant melanoma of skin: Secondary | ICD-10-CM | POA: Diagnosis not present

## 2022-08-21 DIAGNOSIS — D225 Melanocytic nevi of trunk: Secondary | ICD-10-CM | POA: Diagnosis not present

## 2022-08-28 DIAGNOSIS — F411 Generalized anxiety disorder: Secondary | ICD-10-CM | POA: Diagnosis not present

## 2022-08-28 DIAGNOSIS — E7800 Pure hypercholesterolemia, unspecified: Secondary | ICD-10-CM | POA: Diagnosis not present

## 2022-08-28 DIAGNOSIS — I10 Essential (primary) hypertension: Secondary | ICD-10-CM | POA: Diagnosis not present

## 2022-08-28 DIAGNOSIS — Z0000 Encounter for general adult medical examination without abnormal findings: Secondary | ICD-10-CM | POA: Diagnosis not present

## 2022-08-28 DIAGNOSIS — E559 Vitamin D deficiency, unspecified: Secondary | ICD-10-CM | POA: Diagnosis not present

## 2022-10-03 DIAGNOSIS — M5412 Radiculopathy, cervical region: Secondary | ICD-10-CM | POA: Diagnosis not present

## 2022-10-16 DIAGNOSIS — M5412 Radiculopathy, cervical region: Secondary | ICD-10-CM | POA: Diagnosis not present

## 2022-10-16 DIAGNOSIS — M5416 Radiculopathy, lumbar region: Secondary | ICD-10-CM | POA: Diagnosis not present

## 2022-10-16 DIAGNOSIS — Z5181 Encounter for therapeutic drug level monitoring: Secondary | ICD-10-CM | POA: Diagnosis not present

## 2022-10-16 DIAGNOSIS — Z79899 Other long term (current) drug therapy: Secondary | ICD-10-CM | POA: Diagnosis not present

## 2022-10-16 DIAGNOSIS — M25512 Pain in left shoulder: Secondary | ICD-10-CM | POA: Diagnosis not present

## 2022-10-17 DIAGNOSIS — K080 Exfoliation of teeth due to systemic causes: Secondary | ICD-10-CM | POA: Diagnosis not present

## 2022-10-17 DIAGNOSIS — M5412 Radiculopathy, cervical region: Secondary | ICD-10-CM | POA: Diagnosis not present

## 2022-11-06 DIAGNOSIS — M5010 Cervical disc disorder with radiculopathy, unspecified cervical region: Secondary | ICD-10-CM | POA: Diagnosis not present

## 2022-11-06 DIAGNOSIS — M25512 Pain in left shoulder: Secondary | ICD-10-CM | POA: Diagnosis not present

## 2022-11-18 DIAGNOSIS — M5412 Radiculopathy, cervical region: Secondary | ICD-10-CM | POA: Diagnosis not present

## 2022-11-25 DIAGNOSIS — M542 Cervicalgia: Secondary | ICD-10-CM | POA: Diagnosis not present

## 2022-11-25 DIAGNOSIS — M5412 Radiculopathy, cervical region: Secondary | ICD-10-CM | POA: Diagnosis not present

## 2022-11-26 DIAGNOSIS — H524 Presbyopia: Secondary | ICD-10-CM | POA: Diagnosis not present

## 2022-11-26 DIAGNOSIS — H04123 Dry eye syndrome of bilateral lacrimal glands: Secondary | ICD-10-CM | POA: Diagnosis not present

## 2022-11-26 DIAGNOSIS — H52222 Regular astigmatism, left eye: Secondary | ICD-10-CM | POA: Diagnosis not present

## 2022-11-27 DIAGNOSIS — M5412 Radiculopathy, cervical region: Secondary | ICD-10-CM | POA: Diagnosis not present

## 2022-11-27 DIAGNOSIS — M542 Cervicalgia: Secondary | ICD-10-CM | POA: Diagnosis not present

## 2022-12-04 DIAGNOSIS — M5412 Radiculopathy, cervical region: Secondary | ICD-10-CM | POA: Diagnosis not present

## 2022-12-04 DIAGNOSIS — M542 Cervicalgia: Secondary | ICD-10-CM | POA: Diagnosis not present

## 2022-12-09 DIAGNOSIS — M542 Cervicalgia: Secondary | ICD-10-CM | POA: Diagnosis not present

## 2022-12-09 DIAGNOSIS — M5412 Radiculopathy, cervical region: Secondary | ICD-10-CM | POA: Diagnosis not present

## 2022-12-11 DIAGNOSIS — M5412 Radiculopathy, cervical region: Secondary | ICD-10-CM | POA: Diagnosis not present

## 2022-12-11 DIAGNOSIS — M542 Cervicalgia: Secondary | ICD-10-CM | POA: Diagnosis not present

## 2022-12-18 DIAGNOSIS — M5412 Radiculopathy, cervical region: Secondary | ICD-10-CM | POA: Diagnosis not present

## 2022-12-18 DIAGNOSIS — M542 Cervicalgia: Secondary | ICD-10-CM | POA: Diagnosis not present

## 2022-12-23 DIAGNOSIS — M542 Cervicalgia: Secondary | ICD-10-CM | POA: Diagnosis not present

## 2022-12-23 DIAGNOSIS — M5412 Radiculopathy, cervical region: Secondary | ICD-10-CM | POA: Diagnosis not present

## 2022-12-25 DIAGNOSIS — M542 Cervicalgia: Secondary | ICD-10-CM | POA: Diagnosis not present

## 2022-12-25 DIAGNOSIS — M5412 Radiculopathy, cervical region: Secondary | ICD-10-CM | POA: Diagnosis not present

## 2023-01-06 DIAGNOSIS — H5211 Myopia, right eye: Secondary | ICD-10-CM | POA: Diagnosis not present

## 2023-01-21 DIAGNOSIS — M5412 Radiculopathy, cervical region: Secondary | ICD-10-CM | POA: Diagnosis not present

## 2023-01-21 DIAGNOSIS — M5030 Other cervical disc degeneration, unspecified cervical region: Secondary | ICD-10-CM | POA: Diagnosis not present

## 2023-01-21 DIAGNOSIS — M5416 Radiculopathy, lumbar region: Secondary | ICD-10-CM | POA: Diagnosis not present

## 2023-02-06 DIAGNOSIS — M5412 Radiculopathy, cervical region: Secondary | ICD-10-CM | POA: Diagnosis not present

## 2023-02-20 DIAGNOSIS — K080 Exfoliation of teeth due to systemic causes: Secondary | ICD-10-CM | POA: Diagnosis not present

## 2023-02-25 DIAGNOSIS — L820 Inflamed seborrheic keratosis: Secondary | ICD-10-CM | POA: Diagnosis not present

## 2023-02-25 DIAGNOSIS — L814 Other melanin hyperpigmentation: Secondary | ICD-10-CM | POA: Diagnosis not present

## 2023-02-25 DIAGNOSIS — L538 Other specified erythematous conditions: Secondary | ICD-10-CM | POA: Diagnosis not present

## 2023-02-25 DIAGNOSIS — D2372 Other benign neoplasm of skin of left lower limb, including hip: Secondary | ICD-10-CM | POA: Diagnosis not present

## 2023-02-25 DIAGNOSIS — L821 Other seborrheic keratosis: Secondary | ICD-10-CM | POA: Diagnosis not present

## 2023-02-25 DIAGNOSIS — L84 Corns and callosities: Secondary | ICD-10-CM | POA: Diagnosis not present

## 2023-02-25 DIAGNOSIS — L570 Actinic keratosis: Secondary | ICD-10-CM | POA: Diagnosis not present

## 2023-03-04 DIAGNOSIS — F3341 Major depressive disorder, recurrent, in partial remission: Secondary | ICD-10-CM | POA: Diagnosis not present

## 2023-03-04 DIAGNOSIS — F411 Generalized anxiety disorder: Secondary | ICD-10-CM | POA: Diagnosis not present

## 2023-03-04 DIAGNOSIS — I10 Essential (primary) hypertension: Secondary | ICD-10-CM | POA: Diagnosis not present

## 2023-03-04 DIAGNOSIS — E559 Vitamin D deficiency, unspecified: Secondary | ICD-10-CM | POA: Diagnosis not present

## 2023-03-31 DIAGNOSIS — D239 Other benign neoplasm of skin, unspecified: Secondary | ICD-10-CM | POA: Diagnosis not present

## 2023-04-10 DIAGNOSIS — D239 Other benign neoplasm of skin, unspecified: Secondary | ICD-10-CM | POA: Diagnosis not present

## 2023-04-10 DIAGNOSIS — B9689 Other specified bacterial agents as the cause of diseases classified elsewhere: Secondary | ICD-10-CM | POA: Diagnosis not present

## 2023-04-24 DIAGNOSIS — K080 Exfoliation of teeth due to systemic causes: Secondary | ICD-10-CM | POA: Diagnosis not present

## 2023-05-08 DIAGNOSIS — L980 Pyogenic granuloma: Secondary | ICD-10-CM | POA: Diagnosis not present

## 2023-05-08 DIAGNOSIS — L0101 Non-bullous impetigo: Secondary | ICD-10-CM | POA: Diagnosis not present

## 2023-05-15 DIAGNOSIS — K080 Exfoliation of teeth due to systemic causes: Secondary | ICD-10-CM | POA: Diagnosis not present

## 2023-05-29 DIAGNOSIS — L980 Pyogenic granuloma: Secondary | ICD-10-CM | POA: Diagnosis not present

## 2023-05-29 DIAGNOSIS — L0101 Non-bullous impetigo: Secondary | ICD-10-CM | POA: Diagnosis not present

## 2023-06-09 DIAGNOSIS — M5412 Radiculopathy, cervical region: Secondary | ICD-10-CM | POA: Diagnosis not present

## 2023-06-10 DIAGNOSIS — L905 Scar conditions and fibrosis of skin: Secondary | ICD-10-CM | POA: Diagnosis not present

## 2023-06-10 DIAGNOSIS — R234 Changes in skin texture: Secondary | ICD-10-CM | POA: Diagnosis not present

## 2023-06-10 DIAGNOSIS — H02835 Dermatochalasis of left lower eyelid: Secondary | ICD-10-CM | POA: Diagnosis not present

## 2023-06-19 DIAGNOSIS — L0101 Non-bullous impetigo: Secondary | ICD-10-CM | POA: Diagnosis not present

## 2023-06-19 DIAGNOSIS — L820 Inflamed seborrheic keratosis: Secondary | ICD-10-CM | POA: Diagnosis not present

## 2023-07-02 DIAGNOSIS — M5412 Radiculopathy, cervical region: Secondary | ICD-10-CM | POA: Diagnosis not present

## 2023-07-04 DIAGNOSIS — I10 Essential (primary) hypertension: Secondary | ICD-10-CM | POA: Diagnosis not present

## 2023-08-07 DIAGNOSIS — M1990 Unspecified osteoarthritis, unspecified site: Secondary | ICD-10-CM | POA: Diagnosis not present

## 2023-08-07 DIAGNOSIS — E7800 Pure hypercholesterolemia, unspecified: Secondary | ICD-10-CM | POA: Diagnosis not present

## 2023-08-07 DIAGNOSIS — F339 Major depressive disorder, recurrent, unspecified: Secondary | ICD-10-CM | POA: Diagnosis not present

## 2023-08-07 DIAGNOSIS — F411 Generalized anxiety disorder: Secondary | ICD-10-CM | POA: Diagnosis not present

## 2023-08-14 DIAGNOSIS — I10 Essential (primary) hypertension: Secondary | ICD-10-CM | POA: Diagnosis not present

## 2023-08-27 DIAGNOSIS — S80862A Insect bite (nonvenomous), left lower leg, initial encounter: Secondary | ICD-10-CM | POA: Diagnosis not present

## 2023-08-27 DIAGNOSIS — L814 Other melanin hyperpigmentation: Secondary | ICD-10-CM | POA: Diagnosis not present

## 2023-08-27 DIAGNOSIS — D225 Melanocytic nevi of trunk: Secondary | ICD-10-CM | POA: Diagnosis not present

## 2023-08-27 DIAGNOSIS — L821 Other seborrheic keratosis: Secondary | ICD-10-CM | POA: Diagnosis not present

## 2023-08-27 DIAGNOSIS — L820 Inflamed seborrheic keratosis: Secondary | ICD-10-CM | POA: Diagnosis not present

## 2023-08-27 DIAGNOSIS — L538 Other specified erythematous conditions: Secondary | ICD-10-CM | POA: Diagnosis not present

## 2023-09-07 DIAGNOSIS — I10 Essential (primary) hypertension: Secondary | ICD-10-CM | POA: Diagnosis not present

## 2023-09-07 DIAGNOSIS — F339 Major depressive disorder, recurrent, unspecified: Secondary | ICD-10-CM | POA: Diagnosis not present

## 2023-09-07 DIAGNOSIS — F411 Generalized anxiety disorder: Secondary | ICD-10-CM | POA: Diagnosis not present

## 2023-09-07 DIAGNOSIS — M1990 Unspecified osteoarthritis, unspecified site: Secondary | ICD-10-CM | POA: Diagnosis not present

## 2023-09-07 DIAGNOSIS — E7800 Pure hypercholesterolemia, unspecified: Secondary | ICD-10-CM | POA: Diagnosis not present

## 2023-09-13 DIAGNOSIS — I10 Essential (primary) hypertension: Secondary | ICD-10-CM | POA: Diagnosis not present

## 2023-09-15 DIAGNOSIS — K080 Exfoliation of teeth due to systemic causes: Secondary | ICD-10-CM | POA: Diagnosis not present

## 2023-09-16 DIAGNOSIS — M8588 Other specified disorders of bone density and structure, other site: Secondary | ICD-10-CM | POA: Diagnosis not present

## 2023-09-16 DIAGNOSIS — E559 Vitamin D deficiency, unspecified: Secondary | ICD-10-CM | POA: Diagnosis not present

## 2023-09-16 DIAGNOSIS — Z0000 Encounter for general adult medical examination without abnormal findings: Secondary | ICD-10-CM | POA: Diagnosis not present

## 2023-09-16 DIAGNOSIS — E7800 Pure hypercholesterolemia, unspecified: Secondary | ICD-10-CM | POA: Diagnosis not present

## 2023-09-16 DIAGNOSIS — F3341 Major depressive disorder, recurrent, in partial remission: Secondary | ICD-10-CM | POA: Diagnosis not present

## 2023-09-16 DIAGNOSIS — I10 Essential (primary) hypertension: Secondary | ICD-10-CM | POA: Diagnosis not present

## 2023-09-18 DIAGNOSIS — Z1231 Encounter for screening mammogram for malignant neoplasm of breast: Secondary | ICD-10-CM | POA: Diagnosis not present

## 2023-09-22 DIAGNOSIS — M542 Cervicalgia: Secondary | ICD-10-CM | POA: Diagnosis not present

## 2023-09-22 DIAGNOSIS — Z79899 Other long term (current) drug therapy: Secondary | ICD-10-CM | POA: Diagnosis not present

## 2023-09-23 DIAGNOSIS — Z23 Encounter for immunization: Secondary | ICD-10-CM | POA: Diagnosis not present

## 2023-10-07 DIAGNOSIS — F339 Major depressive disorder, recurrent, unspecified: Secondary | ICD-10-CM | POA: Diagnosis not present

## 2023-10-07 DIAGNOSIS — I10 Essential (primary) hypertension: Secondary | ICD-10-CM | POA: Diagnosis not present

## 2023-10-07 DIAGNOSIS — F411 Generalized anxiety disorder: Secondary | ICD-10-CM | POA: Diagnosis not present

## 2023-10-07 DIAGNOSIS — E7800 Pure hypercholesterolemia, unspecified: Secondary | ICD-10-CM | POA: Diagnosis not present

## 2023-10-07 DIAGNOSIS — M1990 Unspecified osteoarthritis, unspecified site: Secondary | ICD-10-CM | POA: Diagnosis not present

## 2023-10-10 DIAGNOSIS — M542 Cervicalgia: Secondary | ICD-10-CM | POA: Diagnosis not present

## 2023-10-13 DIAGNOSIS — I10 Essential (primary) hypertension: Secondary | ICD-10-CM | POA: Diagnosis not present

## 2023-10-27 DIAGNOSIS — M5416 Radiculopathy, lumbar region: Secondary | ICD-10-CM | POA: Diagnosis not present

## 2023-10-27 DIAGNOSIS — M5412 Radiculopathy, cervical region: Secondary | ICD-10-CM | POA: Diagnosis not present

## 2023-11-06 DIAGNOSIS — M542 Cervicalgia: Secondary | ICD-10-CM | POA: Diagnosis not present

## 2023-11-07 DIAGNOSIS — I10 Essential (primary) hypertension: Secondary | ICD-10-CM | POA: Diagnosis not present

## 2023-11-07 DIAGNOSIS — E7800 Pure hypercholesterolemia, unspecified: Secondary | ICD-10-CM | POA: Diagnosis not present

## 2023-11-07 DIAGNOSIS — F411 Generalized anxiety disorder: Secondary | ICD-10-CM | POA: Diagnosis not present

## 2023-11-07 DIAGNOSIS — F339 Major depressive disorder, recurrent, unspecified: Secondary | ICD-10-CM | POA: Diagnosis not present

## 2023-11-07 DIAGNOSIS — M1990 Unspecified osteoarthritis, unspecified site: Secondary | ICD-10-CM | POA: Diagnosis not present

## 2023-11-12 DIAGNOSIS — I10 Essential (primary) hypertension: Secondary | ICD-10-CM | POA: Diagnosis not present

## 2023-11-18 DIAGNOSIS — M5412 Radiculopathy, cervical region: Secondary | ICD-10-CM | POA: Diagnosis not present

## 2023-12-07 DIAGNOSIS — I10 Essential (primary) hypertension: Secondary | ICD-10-CM | POA: Diagnosis not present

## 2023-12-07 DIAGNOSIS — F411 Generalized anxiety disorder: Secondary | ICD-10-CM | POA: Diagnosis not present

## 2023-12-07 DIAGNOSIS — F339 Major depressive disorder, recurrent, unspecified: Secondary | ICD-10-CM | POA: Diagnosis not present

## 2023-12-07 DIAGNOSIS — E7800 Pure hypercholesterolemia, unspecified: Secondary | ICD-10-CM | POA: Diagnosis not present

## 2023-12-07 DIAGNOSIS — M1990 Unspecified osteoarthritis, unspecified site: Secondary | ICD-10-CM | POA: Diagnosis not present

## 2023-12-07 DIAGNOSIS — F4381 Prolonged grief disorder: Secondary | ICD-10-CM | POA: Diagnosis not present
